# Patient Record
Sex: Male | Born: 1955 | Race: Black or African American | Hispanic: No | Marital: Single | Smoking: Current every day smoker
Health system: Southern US, Community
[De-identification: ages and names within clinical notes are randomized; demographics above are authoritative.]

## PROBLEM LIST (undated history)

## (undated) DIAGNOSIS — E119 Type 2 diabetes mellitus without complications: Secondary | ICD-10-CM

## (undated) DIAGNOSIS — J449 Chronic obstructive pulmonary disease, unspecified: Secondary | ICD-10-CM

## (undated) DIAGNOSIS — J45909 Unspecified asthma, uncomplicated: Secondary | ICD-10-CM

## (undated) DIAGNOSIS — I509 Heart failure, unspecified: Secondary | ICD-10-CM

## (undated) DIAGNOSIS — I2510 Atherosclerotic heart disease of native coronary artery without angina pectoris: Secondary | ICD-10-CM

---

## 2019-07-05 ENCOUNTER — Emergency Department: Payer: Medicare Other

## 2019-07-05 ENCOUNTER — Other Ambulatory Visit: Payer: Self-pay

## 2019-07-05 ENCOUNTER — Emergency Department
Admission: EM | Admit: 2019-07-05 | Discharge: 2019-07-05 | Disposition: A | Discharge status: Home / Self Care | Payer: Medicare Other | Attending: Emergency Medicine | Admitting: Emergency Medicine

## 2019-07-05 ENCOUNTER — Encounter: Payer: Self-pay | Admitting: Emergency Medicine

## 2019-07-05 DIAGNOSIS — Z20822 Contact with and (suspected) exposure to covid-19: Secondary | ICD-10-CM | POA: Diagnosis not present

## 2019-07-05 DIAGNOSIS — R079 Chest pain, unspecified: Secondary | ICD-10-CM | POA: Insufficient documentation

## 2019-07-05 DIAGNOSIS — J45909 Unspecified asthma, uncomplicated: Secondary | ICD-10-CM | POA: Diagnosis not present

## 2019-07-05 DIAGNOSIS — I2510 Atherosclerotic heart disease of native coronary artery without angina pectoris: Secondary | ICD-10-CM | POA: Insufficient documentation

## 2019-07-05 DIAGNOSIS — F17210 Nicotine dependence, cigarettes, uncomplicated: Secondary | ICD-10-CM | POA: Diagnosis not present

## 2019-07-05 DIAGNOSIS — E119 Type 2 diabetes mellitus without complications: Secondary | ICD-10-CM | POA: Insufficient documentation

## 2019-07-05 DIAGNOSIS — R0602 Shortness of breath: Secondary | ICD-10-CM | POA: Diagnosis not present

## 2019-07-05 DIAGNOSIS — J441 Chronic obstructive pulmonary disease with (acute) exacerbation: Secondary | ICD-10-CM | POA: Diagnosis not present

## 2019-07-05 HISTORY — DX: Heart failure, unspecified: I50.9

## 2019-07-05 HISTORY — DX: Type 2 diabetes mellitus without complications: E11.9

## 2019-07-05 HISTORY — DX: Chronic obstructive pulmonary disease, unspecified: J44.9

## 2019-07-05 HISTORY — DX: Atherosclerotic heart disease of native coronary artery without angina pectoris: I25.10

## 2019-07-05 HISTORY — DX: Unspecified asthma, uncomplicated: J45.909

## 2019-07-05 LAB — COMPREHENSIVE METABOLIC PANEL
ALT: 102 U/L — ABNORMAL HIGH (ref 0–44)
AST: 47 U/L — ABNORMAL HIGH (ref 15–41)
Albumin: 4 g/dL (ref 3.5–5.0)
Alkaline Phosphatase: 63 U/L (ref 38–126)
Anion gap: 14 (ref 5–15)
BUN: 35 mg/dL — ABNORMAL HIGH (ref 8–23)
CO2: 27 mmol/L (ref 22–32)
Calcium: 9.1 mg/dL (ref 8.9–10.3)
Chloride: 98 mmol/L (ref 98–111)
Creatinine, Ser: 1.46 mg/dL — ABNORMAL HIGH (ref 0.61–1.24)
GFR calc Af Amer: 59 mL/min — ABNORMAL LOW (ref >60)
GFR calc non Af Amer: 51 mL/min — ABNORMAL LOW (ref >60)
Glucose, Bld: 103 mg/dL — ABNORMAL HIGH (ref 70–99)
Potassium: 3.4 mmol/L — ABNORMAL LOW (ref 3.5–5.1)
Sodium: 139 mmol/L (ref 135–145)
Total Bilirubin: 1.3 mg/dL — ABNORMAL HIGH (ref 0.3–1.2)
Total Protein: 6.7 g/dL (ref 6.5–8.1)

## 2019-07-05 LAB — RESPIRATORY PANEL BY RT PCR (FLU A&B, COVID)
Influenza A by PCR: NEGATIVE
Influenza B by PCR: NEGATIVE
SARS Coronavirus 2 by RT PCR: NEGATIVE

## 2019-07-05 LAB — CBC WITH DIFFERENTIAL/PLATELET
Abs Immature Granulocytes: 0.02 10*3/uL (ref 0.00–0.07)
Basophils Absolute: 0.1 10*3/uL (ref 0.0–0.1)
Basophils Relative: 1 %
Eosinophils Absolute: 0.1 10*3/uL (ref 0.0–0.5)
Eosinophils Relative: 1 %
HCT: 45.4 % (ref 39.0–52.0)
Hemoglobin: 15.4 g/dL (ref 13.0–17.0)
Immature Granulocytes: 0 %
Lymphocytes Relative: 16 %
Lymphs Abs: 0.9 10*3/uL (ref 0.7–4.0)
MCH: 32.1 pg (ref 26.0–34.0)
MCHC: 33.9 g/dL (ref 30.0–36.0)
MCV: 94.6 fL (ref 80.0–100.0)
Monocytes Absolute: 0.4 10*3/uL (ref 0.1–1.0)
Monocytes Relative: 6 %
Neutro Abs: 4.2 10*3/uL (ref 1.7–7.7)
Neutrophils Relative %: 76 %
Platelets: 209 10*3/uL (ref 150–400)
RBC: 4.8 MIL/uL (ref 4.22–5.81)
RDW: 15.5 % (ref 11.5–15.5)
WBC: 5.5 10*3/uL (ref 4.0–10.5)
nRBC: 0 % (ref 0.0–0.2)

## 2019-07-05 LAB — TROPONIN I (HIGH SENSITIVITY)
Troponin I (High Sensitivity): 45 ng/L — ABNORMAL HIGH (ref <18)
Troponin I (High Sensitivity): 39 ng/L — ABNORMAL HIGH (ref <18)

## 2019-07-05 LAB — BRAIN NATRIURETIC PEPTIDE: B Natriuretic Peptide: 2514 pg/mL — ABNORMAL HIGH (ref 0.0–100.0)

## 2019-07-05 MED ORDER — IPRATROPIUM-ALBUTEROL 0.5-2.5 (3) MG/3ML IN SOLN
3.0000 mL | Freq: Once | RESPIRATORY_TRACT | Status: AC
  Administered 2019-07-05: 3 mL via RESPIRATORY_TRACT
  Filled 2019-07-05: qty 3

## 2019-07-05 MED ORDER — ALBUTEROL SULFATE HFA 108 (90 BASE) MCG/ACT IN AERS: 2.0000 | INHALATION_SPRAY | Freq: Four times a day (QID) | RESPIRATORY_TRACT | 1 refills | Status: DC | PRN

## 2019-07-05 MED ORDER — PREDNISONE 20 MG PO TABS
60.0000 mg | ORAL_TABLET | Freq: Once | ORAL | Status: AC
  Administered 2019-07-05: 60 mg via ORAL
  Filled 2019-07-05: qty 3

## 2019-07-05 MED ORDER — PREDNISONE 20 MG PO TABS: 20.0000 mg | ORAL_TABLET | Freq: Every day | ORAL | 0 refills | Status: AC

## 2019-07-05 MED ORDER — IOHEXOL 300 MG/ML  SOLN
75.0000 mL | Freq: Once | INTRAMUSCULAR | Status: AC | PRN
  Administered 2019-07-05: 14:00:00 75 mL via INTRAVENOUS

## 2019-07-05 NOTE — Discharge Instructions (Addendum)
Your Covid test today is negative.  You should follow-up with a cardiologist.  We have given you a name for a doctor at each of our 2 cardiology practices.  You can choose whichever one works best for you.  Take the prednisone 60 mg again tomorrow, then 40 mg for 2 days, and then go back to your normal daily dose of 20 mg.  Use the albuterol inhaler every 4-6 hours for the next several days as needed.  Continue taking your aspirin, Lasix, and other medications as prescribed.  Return to the ER for new, worsening, or persistent severe chest pain, difficulty breathing, weakness or lightheadedness, or any other new or worsening symptoms that concern you.

## 2019-07-05 NOTE — ED Triage Notes (Signed)
Patient presents to the ED with chest pain since last night.  Patient states the pain is constant and feels like someone is sitting on his chest.  Patient also reports shortness of breath for several days.  Patient states he normally takes prednisone but has been out for approx. 1 month.  Patient recently moved from [place].  Patient has history of blood clots, swelling in his legs and diabetes.  Patient states he takes lasix and blood thinners.

## 2019-07-05 NOTE — ED Notes (Signed)
Pt trx to CT.  

## 2019-07-05 NOTE — ED Provider Notes (Signed)
[facility] Emergency Department Provider Note ____________________________________________   First MD Initiated Contact with Patient 07/05/19 [phone]     (approximate)  I have reviewed the triage vital signs and the nursing notes.   HISTORY  Chief Complaint Chest Pain    HPI Scott Mcknight is a 64 y.o. male with PMH as noted below who presents with chest pain which has been constant over the last several weeks but worse since last night, described as a pressure-like sensation or something sitting on his chest.  He reports exertional shortness of breath for several weeks as well.  The patient states that he recently came here from [place].  He states that he has been off of his daily prednisone for about 1 month and states he has been feeling worse since then.  He reports that he has a history of CAD and CHF and was told that he should get a stent, but does not have any stents.  He denies any known COVID-19 exposures.  Past Medical History:  Diagnosis Date  . Asthma   . CHF (congestive heart failure) (HCC)   . COPD (chronic obstructive pulmonary disease) (HCC)   . Coronary artery disease   . Diabetes mellitus without complication (HCC)     There are no problems to display for this patient.    Prior to Admission medications   Medication Sig Start Date End Date Taking? Authorizing Provider  albuterol (VENTOLIN HFA) 108 (90 Base) MCG/ACT inhaler Inhale 2 puffs into the lungs every 6 (six) hours as needed for wheezing or shortness of breath. 07/05/19   Dionne Bucy, MD  predniSONE (DELTASONE) 20 MG tablet Take 1 tablet (20 mg total) by mouth daily. 07/05/19 08/04/19  Dionne Bucy, MD    Allergies Patient has no known allergies.  No family history on file.  Social History Social History   Tobacco Use  . Smoking status: Current Every Day Smoker    Packs/day: 0.50    Types: Cigarettes  Substance Use Topics  . Alcohol use: Yes   Comment: occasionally  . Drug use: Not on file    Review of Systems  Constitutional: No fever. Eyes: No redness. ENT: No sore throat. Cardiovascular: Positive for chest pain. Respiratory: Positive for shortness of breath. Gastrointestinal: No vomiting or diarrhea.  Genitourinary: Negative for dysuria.  Musculoskeletal: Negative for back pain. Skin: Negative for rash. Neurological: Negative for headache.   ____________________________________________   PHYSICAL EXAM:  VITAL SIGNS: ED Triage Vitals  Enc Vitals Group     BP 07/05/19 0921 (!) 121/96     Pulse Rate 07/05/19 0921 (!) 49     Resp 07/05/19 0921 16     Temp 07/05/19 0921 98 F (36.7 C)     Temp Source 07/05/19 0921 Oral     SpO2 07/05/19 0921 91 %     Weight 07/05/19 0923 190 lb (86.2 kg)     Height 07/05/19 0923 5\' 11"  (1.803 m)     Head Circumference --      Peak Flow --      Pain Score 07/05/19 0923 6     Pain Loc --      Pain Edu? --      Excl. in GC? --     Constitutional: Alert and oriented.  Relatively well appearing and in no acute distress. Eyes: Conjunctivae are normal.  EOMI. Head: Atraumatic. Nose: No congestion/rhinnorhea. Mouth/Throat: Mucous membranes are moist.   Neck: Normal range of motion.  Cardiovascular:  Normal rate, regular rhythm. Grossly normal heart sounds.  Good peripheral circulation. Respiratory: Slightly increased respiratory effort.  No retractions.  Diffuse wheezing bilaterally. Gastrointestinal: Soft and nontender. No distention.  Genitourinary: No flank tenderness. Musculoskeletal: 1+ bilateral lower extremity edema.  Extremities warm and well perfused.  Neurologic:  Normal speech and language. No gross focal neurologic deficits are appreciated.  Skin:  Skin is warm and dry. No rash noted. Psychiatric: Mood and affect are normal. Speech and behavior are normal.  ____________________________________________   LABS (all labs ordered are listed, but only abnormal  results are displayed)  Labs Reviewed  COMPREHENSIVE METABOLIC PANEL - Abnormal; Notable for the following components:      Result Value   Potassium 3.4 (*)    Glucose, Bld 103 (*)    BUN 35 (*)    Creatinine, Ser 1.46 (*)    AST 47 (*)    ALT 102 (*)    Total Bilirubin 1.3 (*)    GFR calc non Af Amer 51 (*)    GFR calc Af Amer 59 (*)    All other components within normal limits  BRAIN NATRIURETIC PEPTIDE - Abnormal; Notable for the following components:   B Natriuretic Peptide 2,514.0 (*)    All other components within normal limits  TROPONIN I (HIGH SENSITIVITY) - Abnormal; Notable for the following components:   Troponin I (High Sensitivity) 45 (*)    All other components within normal limits  TROPONIN I (HIGH SENSITIVITY) - Abnormal; Notable for the following components:   Troponin I (High Sensitivity) 39 (*)    All other components within normal limits  RESPIRATORY PANEL BY RT PCR (FLU A&B, COVID)  CBC WITH DIFFERENTIAL/PLATELET   ____________________________________________  EKG  ED ECG REPORT I, Arta Silence, the attending physician, personally viewed and interpreted this ECG.  Date: 07/05/2019 EKG Time: 09 18 Rate: 100 Rhythm: normal sinus rhythm QRS Axis: Right axis Intervals: Nonspecific IVCD ST/T Wave abnormalities: Nonspecific ST abnormalities inferior and lateral Narrative Interpretation: Nonspecific ST abnormalities inferior lateral with no prior EKG available for comparison   ED ECG REPORT I, Arta Silence, the attending physician, personally viewed and interpreted this ECG.  Date: 07/05/2019 EKG Time: 1343 Rate: 85 Rhythm: normal sinus rhythm QRS Axis: normal Intervals: LBBB ST/T Wave abnormalities: Nonspecific ST abnormalities unchanged when compared to EKG of 918 today Narrative Interpretation: Nonspecific ST abnormalities with no evidence of acute ischemia   ____________________________________________  RADIOLOGY  CXR:    ____________________________________________   PROCEDURES  Procedure(s) performed: No  Procedures  Critical Care performed: No ____________________________________________   INITIAL IMPRESSION / ASSESSMENT AND PLAN / ED COURSE  Pertinent labs & imaging results that were available during my care of the patient were reviewed by me and considered in my medical decision making (see chart for details).  64 year old male with PMH as noted above presents with gradual onset of worsening shortness of breath and chest pain over the last several weeks.  The patient states that he has been off of his daily prednisone 20 mg for about 1 month.  He reports a history of CAD, but does not have any stents.  He states he also was told he has CHF and is on a diuretic.  He takes daily aspirin and confirms that he took it today.  On exam, the patient is overall relatively well-appearing.  His vital signs are normal except for borderline low O2 saturation.  He has slightly increased work of breathing but no acute respiratory distress and  is speaking in full sentences.  Lung exam reveals diffuse wheezing bilaterally.  He has mild peripheral edema.  Differential includes most likely acute on chronic COPD given the predominance of wheezing, versus possible acute on chronic CHF or possible ACS given his high risk cardiac history.  Although the patient apparently has a history of DVT, I have a low suspicion for PE given the gradual nature of the symptoms, his reassuring vital signs, and the presence of wheezing.  We will give duo nebs and steroid, obtain a chest x-ray, lab work-up, and reassess.  ----------------------------------------- 2:42 PM on 07/05/2019 -----------------------------------------  The patient stated he felt much better after the nebs and steroid.  He states his symptoms are resolved.  On reassessment he appears comfortable.  The lab work-up is notable for a slightly elevated troponin, as  well as a significantly elevated BNP.  Since the patient has no prior records here, I do not have old values available for comparison.  I initially discussed with the patient the possibility of admission for additional cardiac work-up and ACS rule out given his cardiac history and the elevated troponin.  However, the patient states that since he feels well, he would strongly prefer to go home if at all possible.  We discussed the possibility of a repeat troponin and possible admission if there was a significant rise signifying an acute cardiac process.  Repeat troponin is  unchanged.  I had extensive discussion with the patient about his work-up and the risks and benefits of further observation versus discharge.  He demonstrates appropriate understanding of these risks, and strongly prefers to be discharged home.  At this time, there is no evidence of an acute MI or other acute cardiac event.  There is no evidence of PE.  The BNP is likely baseline elevated.  The patient is stable for discharge.  I have given the patient a cardiology referral and I have prescribed albuterol and prednisone.  Patient states that he has all of his other medications.  I gave him thorough return precautions and he expressed understanding. _____________________________  Tommy Rainwater was evaluated in Emergency Department on 07/05/2019 for the symptoms described in the history of present illness. He was evaluated in the context of the global COVID-19 pandemic, which necessitated consideration that the patient might be at risk for infection with the SARS-CoV-2 virus that causes COVID-19. Institutional protocols and algorithms that pertain to the evaluation of patients at risk for COVID-19 are in a state of rapid change based on information released by regulatory bodies including the CDC and federal and state organizations. These policies and algorithms were followed during the patient's care in the  ED.  ____________________________________________   FINAL CLINICAL IMPRESSION(S) / ED DIAGNOSES  Final diagnoses:  Chest pain, unspecified type  COPD exacerbation (HCC)      NEW MEDICATIONS STARTED DURING THIS VISIT:  New Prescriptions   ALBUTEROL (VENTOLIN HFA) 108 (90 BASE) MCG/ACT INHALER    Inhale 2 puffs into the lungs every 6 (six) hours as needed for wheezing or shortness of breath.   PREDNISONE (DELTASONE) 20 MG TABLET    Take 1 tablet (20 mg total) by mouth daily.     Note:  This document was prepared using Dragon voice recognition software and may include unintentional dictation errors.    Dionne Bucy, MD 07/05/19 1446

## 2019-07-05 NOTE — ED Notes (Signed)
Pt unhooked himself from monitor to use restroom and I got him put back on the monitor.

## 2019-07-05 NOTE — ED Notes (Signed)
Provided pt w/ graham crackers and soda on request w/ permission from EDP.

## 2019-07-12 ENCOUNTER — Ambulatory Visit: Payer: Medicaid Other | Admitting: Cardiology

## 2019-07-13 ENCOUNTER — Ambulatory Visit: Payer: Medicaid Other | Admitting: Cardiology

## 2019-07-16 ENCOUNTER — Ambulatory Visit: Payer: Medicaid Other | Admitting: Cardiology

## 2019-07-18 ENCOUNTER — Encounter: Payer: Self-pay | Admitting: Cardiology

## 2019-08-19 ENCOUNTER — Emergency Department: Payer: Medicare Other

## 2019-08-19 ENCOUNTER — Emergency Department
Admission: EM | Admit: 2019-08-19 | Discharge: 2019-08-19 | Disposition: A | Discharge status: Home / Self Care | Payer: Medicare Other | Attending: Student | Admitting: Student

## 2019-08-19 ENCOUNTER — Other Ambulatory Visit: Payer: Self-pay

## 2019-08-19 DIAGNOSIS — R0789 Other chest pain: Secondary | ICD-10-CM | POA: Insufficient documentation

## 2019-08-19 DIAGNOSIS — J449 Chronic obstructive pulmonary disease, unspecified: Secondary | ICD-10-CM | POA: Insufficient documentation

## 2019-08-19 DIAGNOSIS — I2510 Atherosclerotic heart disease of native coronary artery without angina pectoris: Secondary | ICD-10-CM | POA: Diagnosis not present

## 2019-08-19 DIAGNOSIS — I509 Heart failure, unspecified: Secondary | ICD-10-CM | POA: Diagnosis not present

## 2019-08-19 DIAGNOSIS — E119 Type 2 diabetes mellitus without complications: Secondary | ICD-10-CM | POA: Insufficient documentation

## 2019-08-19 DIAGNOSIS — F17210 Nicotine dependence, cigarettes, uncomplicated: Secondary | ICD-10-CM | POA: Diagnosis not present

## 2019-08-19 DIAGNOSIS — J441 Chronic obstructive pulmonary disease with (acute) exacerbation: Secondary | ICD-10-CM

## 2019-08-19 DIAGNOSIS — R1033 Periumbilical pain: Secondary | ICD-10-CM | POA: Diagnosis present

## 2019-08-19 DIAGNOSIS — J45909 Unspecified asthma, uncomplicated: Secondary | ICD-10-CM | POA: Diagnosis not present

## 2019-08-19 DIAGNOSIS — R0602 Shortness of breath: Secondary | ICD-10-CM | POA: Diagnosis not present

## 2019-08-19 LAB — CBC
HCT: 44.9 % (ref 39.0–52.0)
Hemoglobin: 14.8 g/dL (ref 13.0–17.0)
MCH: 31.3 pg (ref 26.0–34.0)
MCHC: 33 g/dL (ref 30.0–36.0)
MCV: 94.9 fL (ref 80.0–100.0)
Platelets: 256 10*3/uL (ref 150–400)
RBC: 4.73 MIL/uL (ref 4.22–5.81)
RDW: 14.1 % (ref 11.5–15.5)
WBC: 5.5 10*3/uL (ref 4.0–10.5)
nRBC: 0 % (ref 0.0–0.2)

## 2019-08-19 LAB — COMPREHENSIVE METABOLIC PANEL
ALT: 17 U/L (ref 0–44)
AST: 26 U/L (ref 15–41)
Albumin: 4.3 g/dL (ref 3.5–5.0)
Alkaline Phosphatase: 66 U/L (ref 38–126)
Anion gap: 11 (ref 5–15)
BUN: 11 mg/dL (ref 8–23)
CO2: 27 mmol/L (ref 22–32)
Calcium: 9.8 mg/dL (ref 8.9–10.3)
Chloride: 100 mmol/L (ref 98–111)
Creatinine, Ser: 1.26 mg/dL — ABNORMAL HIGH (ref 0.61–1.24)
GFR calc Af Amer: >60 mL/min (ref >60)
GFR calc non Af Amer: >60 mL/min (ref >60)
Glucose, Bld: 113 mg/dL — ABNORMAL HIGH (ref 70–99)
Potassium: 4.2 mmol/L (ref 3.5–5.1)
Sodium: 138 mmol/L (ref 135–145)
Total Bilirubin: 0.6 mg/dL (ref 0.3–1.2)
Total Protein: 7.7 g/dL (ref 6.5–8.1)

## 2019-08-19 LAB — TROPONIN I (HIGH SENSITIVITY)
Troponin I (High Sensitivity): 25 ng/L — ABNORMAL HIGH (ref <18)
Troponin I (High Sensitivity): 24 ng/L — ABNORMAL HIGH (ref <18)

## 2019-08-19 MED ORDER — AZITHROMYCIN 250 MG PO TABS: 250.0000 mg | ORAL_TABLET | Freq: Every day | ORAL | 0 refills | Status: AC

## 2019-08-19 MED ORDER — PREDNISONE 20 MG PO TABS: 40.0000 mg | ORAL_TABLET | Freq: Once | ORAL | Status: DC

## 2019-08-19 MED ORDER — IOHEXOL 350 MG/ML SOLN
100.0000 mL | Freq: Once | INTRAVENOUS | Status: AC | PRN
  Administered 2019-08-19: 100 mL via INTRAVENOUS

## 2019-08-19 MED ORDER — MORPHINE SULFATE (PF) 4 MG/ML IV SOLN
4.0000 mg | Freq: Once | INTRAVENOUS | Status: AC
  Administered 2019-08-19: 4 mg via INTRAVENOUS
  Filled 2019-08-19: qty 1

## 2019-08-19 MED ORDER — AZITHROMYCIN 500 MG PO TABS
500.0000 mg | ORAL_TABLET | Freq: Once | ORAL | Status: AC
  Administered 2019-08-19: 500 mg via ORAL
  Filled 2019-08-19: qty 1

## 2019-08-19 MED ORDER — PREDNISONE 20 MG PO TABS: 40.0000 mg | ORAL_TABLET | Freq: Every day | ORAL | 0 refills | Status: AC

## 2019-08-19 MED ORDER — SODIUM CHLORIDE 0.9 % IV BOLUS
1000.0000 mL | Freq: Once | INTRAVENOUS | Status: AC
  Administered 2019-08-19: 1000 mL via INTRAVENOUS

## 2019-08-19 MED ORDER — METHYLPREDNISOLONE SODIUM SUCC 125 MG IJ SOLR
125.0000 mg | Freq: Once | INTRAMUSCULAR | Status: AC
  Administered 2019-08-19: 125 mg via INTRAVENOUS
  Filled 2019-08-19: qty 2

## 2019-08-19 MED ORDER — IPRATROPIUM-ALBUTEROL 0.5-2.5 (3) MG/3ML IN SOLN
3.0000 mL | Freq: Once | RESPIRATORY_TRACT | Status: AC
  Administered 2019-08-19: 3 mL via RESPIRATORY_TRACT
  Filled 2019-08-19: qty 3

## 2019-08-19 MED ORDER — ONDANSETRON HCL 4 MG/2ML IJ SOLN
4.0000 mg | Freq: Once | INTRAMUSCULAR | Status: AC
  Administered 2019-08-19: 4 mg via INTRAVENOUS
  Filled 2019-08-19: qty 2

## 2019-08-19 NOTE — ED Provider Notes (Signed)
[facility] Emergency Department Provider Note  ____________________________________________   First MD Initiated Contact with Patient 08/19/19 1728     (approximate)  I have reviewed the triage vital signs and the nursing notes.   HISTORY  Chief Complaint Abdominal Pain and Shortness of Breath   HPI Scott Mcknight is a 64 y.o. male with a history of CAD, COPD, CHF, and diabetes who presents to the emergency department for treatment and evaluation of periumbilical pain and chest pain.   Pain initially was in the abdomen and has now progressed up into his chest.  Symptoms started 2 days ago.   Past Medical History:  Diagnosis Date  . Asthma   . CHF (congestive heart failure) (HCC)   . COPD (chronic obstructive pulmonary disease) (HCC)   . Coronary artery disease   . Diabetes mellitus without complication (HCC)     There are no problems to display for this patient.   History reviewed. No pertinent surgical history.  Prior to Admission medications   Medication Sig Start Date End Date Taking? Authorizing Provider  albuterol (VENTOLIN HFA) 108 (90 Base) MCG/ACT inhaler Inhale 2 puffs into the lungs every 6 (six) hours as needed for wheezing or shortness of breath. 07/05/19   Dionne Bucy, MD    Allergies Patient has no known allergies.  No family history on file.  Social History Social History   Tobacco Use  . Smoking status: Current Every Day Smoker    Packs/day: 0.50    Types: Cigarettes  . Smokeless tobacco: Never Used  Substance Use Topics  . Alcohol use: Yes    Comment: occasionally  . Drug use: Never    Review of Systems  Constitutional: No fever/chills. Eyes: No visual changes. ENT: No sore throat. Cardiovascular: Positive for chest pain.  Negative for pleuritic pain.  Negative for palpitations.  Negative for leg pain. Respiratory: Positive for shortness of breath. Gastrointestinal: Positive for abdominal pain.  Negative  for nausea, no vomiting.  No diarrhea.  No constipation. Genitourinary: Negative for dysuria. Musculoskeletal: Negative for back pain.  Skin: Negative for rash, lesion, wound. Neurological: Negative for headaches, focal weakness or numbness. ___________________________________________   PHYSICAL EXAM:  VITAL SIGNS: ED Triage Vitals  Enc Vitals Group     BP 08/19/19 1717 101/83     Pulse Rate 08/19/19 1717 (!) 45     Resp 08/19/19 1717 (!) 24     Temp 08/19/19 1717 98 F (36.7 C)     Temp Source 08/19/19 1717 Oral     SpO2 08/19/19 1717 95 %     Weight 08/19/19 1719 180 lb (81.6 kg)     Height 08/19/19 1719 5\' 11"  (1.803 m)     Head Circumference --      Peak Flow --      Pain Score 08/19/19 1718 8     Pain Loc --      Pain Edu? --      Excl. in GC? --     Constitutional: Alert and oriented.  Uncomfortable appearing and in no acute distress.  Normal mental status. Eyes: Conjunctivae are normal. PERRL. Head: Atraumatic. Nose: No congestion/rhinnorhea. Mouth/Throat: Mucous membranes are moist.  Oropharynx non-erythematous. Tongue normal in size and color. Neck: No stridor.  No carotid bruit appreciated on exam. Hematological/Lymphatic/Immunilogical: No cervical lymphadenopathy. Cardiovascular: Normal rate, regular rhythm. Grossly normal heart sounds.  Good peripheral circulation. Respiratory: Normal respiratory effort.  No retractions. Lungs with diffuse expiratory wheeze. Gastrointestinal: Soft. No distention.  No abdominal bruits. Genitourinary: Exam deferred. Musculoskeletal: No lower extremity tenderness.  No edema of extremities. Neurologic:  Normal speech and language. No gross focal neurologic deficits are appreciated. Skin:  Skin is warm, dry and intact. No rash noted. Psychiatric: Mood and affect are normal. Speech and behavior are normal.  ____________________________________________   LABS (all labs ordered are listed, but only abnormal results are  displayed)  Labs Reviewed  COMPREHENSIVE METABOLIC PANEL - Abnormal; Notable for the following components:      Result Value   Glucose, Bld 113 (*)    Creatinine, Ser 1.26 (*)    All other components within normal limits  TROPONIN I (HIGH SENSITIVITY) - Abnormal; Notable for the following components:   Troponin I (High Sensitivity) 25 (*)    All other components within normal limits  CBC  TROPONIN I (HIGH SENSITIVITY)   ____________________________________________  EKG  ED ECG REPORT I, Julieana Eshleman, FNP-BC personally viewed and interpreted this ECG.   Date: 08/19/2019  EKG Time: 1723  Rate: 99  Rhythm: unchanged from previous tracings, frequent PVC's noted, prolonged QT interval  Axis: Right  Intervals:none  ST&T Change: no ST elevation  ____________________________________________  RADIOLOGY  ED MD interpretation:    Chest x-ray negative for acute cardiopulmonary abnormality.  I, Kem Boroughs, personally viewed and evaluated these images (plain radiographs) as part of my medical decision making, as well as reviewing the written report by the radiologist.  Official radiology report(s): DG Chest Port 1 View  Result Date: 08/19/2019 CLINICAL DATA:  Chest pain EXAM: PORTABLE CHEST 1 VIEW COMPARISON:  Chest radiograph dated 07/05/2019 FINDINGS: Heart is mildly enlarged. Both lungs are clear. The visualized skeletal structures are unremarkable. IMPRESSION: No active disease. Electronically Signed   By: Romona Curls M.D.   On: 08/19/2019 18:23   CT Angio Chest/Abd/Pel for Dissection W and/or Wo Contrast  Result Date: 08/19/2019 CLINICAL DATA:  Short of breath, abdominal pain EXAM: CT ANGIOGRAPHY CHEST, ABDOMEN AND PELVIS TECHNIQUE: Multidetector CT imaging through the chest, abdomen and pelvis was performed using the standard protocol during bolus administration of intravenous contrast. Multiplanar reconstructed images and MIPs were obtained and reviewed to evaluate the  vascular anatomy. CONTRAST:  OMNIPAQUE IOHEXOL 350 MG/ML SOLN COMPARISON:  07/05/2019 FINDINGS: CTA CHEST FINDINGS Cardiovascular: This is a technically adequate evaluation of the thoracic aorta. No aneurysm or dissection. The heart is mildly enlarged but stable. Continued enlargement of the pulmonary vasculature consistent with pulmonary arterial hypertension. While not optimized for opacification of the pulmonary vasculature, there are no filling defects or pulmonary emboli identified. Mediastinum/Nodes: No enlarged mediastinal, hilar, or axillary lymph nodes. Thyroid gland, trachea, and esophagus demonstrate no significant findings. Lungs/Pleura: Stable background emphysema, upper lobe predominant. No airspace disease, effusion, or pneumothorax. Central airways are patent. Musculoskeletal: No acute or destructive bony lesions. Reconstructed images demonstrate no additional findings. Review of the MIP images confirms the above findings. CTA ABDOMEN AND PELVIS FINDINGS VASCULAR Aorta: Normal caliber aorta without aneurysm, dissection, vasculitis or significant stenosis. Celiac: Patent without evidence of aneurysm, dissection, vasculitis or significant stenosis. SMA: Patent without evidence of aneurysm, dissection, vasculitis or significant stenosis. Renals: Both renal arteries are patent without evidence of aneurysm, dissection, vasculitis, fibromuscular dysplasia or significant stenosis. IMA: Patent without evidence of aneurysm, dissection, vasculitis or significant stenosis. Inflow: Patent without evidence of aneurysm, dissection, vasculitis or significant stenosis. Veins: No obvious venous abnormality within the limitations of this arterial phase study. Review of the MIP images confirms the above findings. NON-VASCULAR  Hepatobiliary: No focal liver abnormality is seen. No gallstones, gallbladder wall thickening, or biliary dilatation. Pancreas: Unremarkable. No pancreatic ductal dilatation or surrounding  inflammatory changes. Spleen: Normal in size without focal abnormality. Adrenals/Urinary Tract: Adrenal glands are unremarkable. Kidneys are normal, without renal calculi, focal lesion, or hydronephrosis. Bladder is unremarkable. Stomach/Bowel: No bowel obstruction or ileus. Normal appendix right lower quadrant. Scattered diverticulosis of the descending and sigmoid colon without diverticulitis. No bowel wall thickening or inflammatory change. Lymphatic: No pathologic adenopathy within the abdomen or pelvis. Reproductive: Prostate is unremarkable. Other: No abdominal wall hernia or abnormality. No abdominopelvic ascites. Musculoskeletal: There is severe osteoarthritis of the right hip, with complete loss of joint space, remodeling of the femoral head and acetabular roof, subchondral cyst formation, eburnation, and marked osteophyte formation. There are no acute displaced fractures. Reconstructed images demonstrate no additional findings. Review of the MIP images confirms the above findings. IMPRESSION: 1. No evidence of aortic aneurysm or dissection. 2. No evidence of pulmonary embolus. Stable pulmonary arterial enlargement consistent with pulmonary arterial hypertension. 3.  Emphysema (ICD10-J43.9). 4. Diverticulosis without diverticulitis. 5. No acute intrathoracic, intra-abdominal, or intrapelvic process. Electronically Signed   By: Randa Ngo M.D.   On: 08/19/2019 18:02    ____________________________________________   PROCEDURES  Procedure(s) performed: None  Procedures  Critical Care performed: No  ____________________________________________   INITIAL IMPRESSION / ASSESSMENT AND PLAN / ED COURSE  As part of my medical decision making, I reviewed the following data within the [facility] EKG reviewed   64 year old male presenting to the emergency department for treatment and evaluation of abdominal and chest pain.  Symptoms started 2 days ago and have progressively  worsened.  See HPI for further details.  Plan will be to get labs and CTA of the chest, abdomen, and pelvis. ____________________________________________  Differential diagnosis includes, but not limited to:  Dissection, non-STEMI, abdominal pain, chest pain   FINAL CLINICAL IMPRESSION(S) / ED DIAGNOSES ----------------------------------------- 6:14 PM on 08/19/2019 -----------------------------------------  CTA is reassuring. No evidence of dissection or other acute findings. Troponin is still pending. CBC is normal. Pain "easing down" and is now 6/10. Vital signs are stable. IV fluids infusing. Will monitor.   ----------------------------------------- 7:08 PM on 08/19/2019 -----------------------------------------  Initial troponin is 25. One month ago it was 68 and 61. Patient states pain is "nearly gone." Breathing treatment and solu-medrol ordered.   ----------------------------------------- 8:23 PM on 08/19/2019 -----------------------------------------  Care will be transitioned to Dr. Joan Mayans who will follow up on the second troponin and determine disposition.  Final diagnoses:  Periumbilical abdominal pain  Atypical chest pain     ED Discharge Orders    None       Granvel Djordjevic was evaluated in Emergency Department on 08/19/2019 for the symptoms described in the history of present illness. He was evaluated in the context of the global COVID-19 pandemic, which necessitated consideration that the patient might be at risk for infection with the SARS-CoV-2 virus that causes COVID-19. Institutional protocols and algorithms that pertain to the evaluation of patients at risk for COVID-19 are in a state of rapid change based on information released by regulatory bodies including the CDC and federal and state organizations. These policies and algorithms were followed during the patient's care in the ED.   Note:  This document was prepared using Dragon voice recognition software  and may include unintentional dictation errors.   Victorino Dike, FNP 08/19/19 2023    Lilia Pro., MD 08/20/19 [id number]

## 2019-08-19 NOTE — ED Provider Notes (Signed)
64 year old male, history of CAD, COPD, heart failure who presents to the emergency department for shortness of breath, chest pain, abdominal pain.  Patient states symptoms started 2 days ago.  Pain initially located in his abdomen, generalized, felt like gas pains.  Pain seemed to radiate up somewhat into his epigastrium and lower chest area.  Took some over-the-counter gas medication without significant relief.  Was able to have a large bowel movement with significant relief of symptoms and thought that symptoms had resolved until today when again he developed some pains, prompting him to seek care. Also complains of increased SOB above his baseline COPD and more productive cough. Does not wear supplemental oxygen.   On my evaluation, after nebulizer treatment he states his breathing is much improved. Has rare scattered wheezing. Abdomen is soft and completely NT throughout after pain medications. He feels well. No oxygen requirement.  CT dissection negative for dissection or PE, emphysema noted. EKG with BBB seen prior. HS troponin 25 then 24, downtrending and actually lower than priors.   He feels much improved after nebulizer and medications and desires discharge. Given negative work up, feel this is reasonable. Will treat for COPD exacerbation and advised PCP follow up.    Scott Mcknight., MD 08/20/19 [id number]

## 2019-08-19 NOTE — ED Triage Notes (Signed)
Pt c/o abd pain and SHOB - he reports that the pain only comes when he takes his medication - Pt is on eliquis for recent blood clots in legs and neck

## 2019-08-19 NOTE — ED Triage Notes (Signed)
FIRST NURSE NOTE:  Pt c/o SOB, abdominal pain, states he was seen somewhere recently and given some pills but states they aren't helping any more.    Pt is short of breath, but refusing to sit in wheelchair. Pt arrived with bag of medications.

## 2019-08-19 NOTE — Discharge Instructions (Addendum)
Thank you for letting us take care of you in the emergency department today.   Please continue to take any regular, prescribed medications.   New medications we have prescribed:  Prednisone steroids Azithromycin antibiotic  Please follow up with: Your primary care doctor to review your ER visit and follow up on your symptoms.    Please return to the ER for any new or worsening symptoms.

## 2019-08-30 ENCOUNTER — Emergency Department: Payer: Medicare Other

## 2019-08-30 ENCOUNTER — Inpatient Hospital Stay
Admission: EM | Admit: 2019-08-30 | Discharge: 2019-08-30 | DRG: 291 | Discharge status: Against Medical Advice | Payer: Medicare Other | Attending: Internal Medicine | Admitting: Internal Medicine

## 2019-08-30 ENCOUNTER — Other Ambulatory Visit: Payer: Self-pay

## 2019-08-30 ENCOUNTER — Inpatient Hospital Stay: Payer: Medicare Other

## 2019-08-30 DIAGNOSIS — E1129 Type 2 diabetes mellitus with other diabetic kidney complication: Secondary | ICD-10-CM | POA: Diagnosis present

## 2019-08-30 DIAGNOSIS — E785 Hyperlipidemia, unspecified: Secondary | ICD-10-CM | POA: Diagnosis present

## 2019-08-30 DIAGNOSIS — R079 Chest pain, unspecified: Secondary | ICD-10-CM | POA: Diagnosis present

## 2019-08-30 DIAGNOSIS — J441 Chronic obstructive pulmonary disease with (acute) exacerbation: Secondary | ICD-10-CM | POA: Diagnosis present

## 2019-08-30 DIAGNOSIS — R778 Other specified abnormalities of plasma proteins: Secondary | ICD-10-CM | POA: Diagnosis not present

## 2019-08-30 DIAGNOSIS — Z86718 Personal history of other venous thrombosis and embolism: Secondary | ICD-10-CM

## 2019-08-30 DIAGNOSIS — I82409 Acute embolism and thrombosis of unspecified deep veins of unspecified lower extremity: Secondary | ICD-10-CM | POA: Diagnosis present

## 2019-08-30 DIAGNOSIS — I5023 Acute on chronic systolic (congestive) heart failure: Secondary | ICD-10-CM | POA: Diagnosis present

## 2019-08-30 DIAGNOSIS — I5033 Acute on chronic diastolic (congestive) heart failure: Secondary | ICD-10-CM

## 2019-08-30 DIAGNOSIS — J9601 Acute respiratory failure with hypoxia: Secondary | ICD-10-CM

## 2019-08-30 DIAGNOSIS — Z9114 Patient's other noncompliance with medication regimen: Secondary | ICD-10-CM | POA: Diagnosis not present

## 2019-08-30 DIAGNOSIS — Z5329 Procedure and treatment not carried out because of patient's decision for other reasons: Secondary | ICD-10-CM | POA: Diagnosis present

## 2019-08-30 DIAGNOSIS — Z79899 Other long term (current) drug therapy: Secondary | ICD-10-CM | POA: Diagnosis not present

## 2019-08-30 DIAGNOSIS — Z7984 Long term (current) use of oral hypoglycemic drugs: Secondary | ICD-10-CM

## 2019-08-30 DIAGNOSIS — Z20822 Contact with and (suspected) exposure to covid-19: Secondary | ICD-10-CM | POA: Diagnosis present

## 2019-08-30 DIAGNOSIS — F17210 Nicotine dependence, cigarettes, uncomplicated: Secondary | ICD-10-CM | POA: Diagnosis present

## 2019-08-30 DIAGNOSIS — I248 Other forms of acute ischemic heart disease: Secondary | ICD-10-CM | POA: Diagnosis present

## 2019-08-30 DIAGNOSIS — Z720 Tobacco use: Secondary | ICD-10-CM | POA: Diagnosis present

## 2019-08-30 DIAGNOSIS — I2510 Atherosclerotic heart disease of native coronary artery without angina pectoris: Secondary | ICD-10-CM | POA: Diagnosis present

## 2019-08-30 DIAGNOSIS — T68XXXA Hypothermia, initial encounter: Secondary | ICD-10-CM | POA: Diagnosis not present

## 2019-08-30 DIAGNOSIS — R680 Hypothermia, not associated with low environmental temperature: Secondary | ICD-10-CM | POA: Diagnosis present

## 2019-08-30 DIAGNOSIS — J9621 Acute and chronic respiratory failure with hypoxia: Secondary | ICD-10-CM | POA: Diagnosis present

## 2019-08-30 LAB — CBC
HCT: 45.2 % (ref 39.0–52.0)
Hemoglobin: 15.1 g/dL (ref 13.0–17.0)
MCH: 31.8 pg (ref 26.0–34.0)
MCHC: 33.4 g/dL (ref 30.0–36.0)
MCV: 95.2 fL (ref 80.0–100.0)
Platelets: 309 10*3/uL (ref 150–400)
RBC: 4.75 MIL/uL (ref 4.22–5.81)
RDW: 14.1 % (ref 11.5–15.5)
WBC: 5.4 10*3/uL (ref 4.0–10.5)
nRBC: 0 % (ref 0.0–0.2)

## 2019-08-30 LAB — COMPREHENSIVE METABOLIC PANEL
ALT: 41 U/L (ref 0–44)
AST: 50 U/L — ABNORMAL HIGH (ref 15–41)
Albumin: 4.1 g/dL (ref 3.5–5.0)
Alkaline Phosphatase: 59 U/L (ref 38–126)
Anion gap: 9 (ref 5–15)
BUN: 28 mg/dL — ABNORMAL HIGH (ref 8–23)
CO2: 26 mmol/L (ref 22–32)
Calcium: 9.2 mg/dL (ref 8.9–10.3)
Chloride: 103 mmol/L (ref 98–111)
Creatinine, Ser: 1.33 mg/dL — ABNORMAL HIGH (ref 0.61–1.24)
GFR calc Af Amer: >60 mL/min (ref >60)
GFR calc non Af Amer: 57 mL/min — ABNORMAL LOW (ref >60)
Glucose, Bld: 109 mg/dL — ABNORMAL HIGH (ref 70–99)
Potassium: 4.1 mmol/L (ref 3.5–5.1)
Sodium: 138 mmol/L (ref 135–145)
Total Bilirubin: 0.8 mg/dL (ref 0.3–1.2)
Total Protein: 7.1 g/dL (ref 6.5–8.1)

## 2019-08-30 LAB — TROPONIN I (HIGH SENSITIVITY)
Troponin I (High Sensitivity): 170 ng/L (ref <18)
Troponin I (High Sensitivity): 137 ng/L (ref <18)
Troponin I (High Sensitivity): 102 ng/L (ref <18)

## 2019-08-30 LAB — BLOOD GAS, VENOUS
Acid-Base Excess: 5.3 mmol/L — ABNORMAL HIGH (ref 0.0–2.0)
Bicarbonate: 31.9 mmol/L — ABNORMAL HIGH (ref 20.0–28.0)
O2 Saturation: 84.9 %
Patient temperature: 37
pCO2, Ven: 54 mmHg (ref 44.0–60.0)
pH, Ven: 7.38 (ref 7.250–7.430)
pO2, Ven: 51 mmHg — ABNORMAL HIGH (ref 32.0–45.0)

## 2019-08-30 LAB — RESPIRATORY PANEL BY RT PCR (FLU A&B, COVID)
Influenza A by PCR: NEGATIVE
Influenza B by PCR: NEGATIVE
SARS Coronavirus 2 by RT PCR: NEGATIVE

## 2019-08-30 LAB — URINE DRUG SCREEN, QUALITATIVE (ARMC ONLY)
Amphetamines, Ur Screen: NOT DETECTED
Barbiturates, Ur Screen: NOT DETECTED
Benzodiazepine, Ur Scrn: NOT DETECTED
Cannabinoid 50 Ng, Ur ~~LOC~~: NOT DETECTED
Cocaine Metabolite,Ur ~~LOC~~: NOT DETECTED
MDMA (Ecstasy)Ur Screen: NOT DETECTED
Methadone Scn, Ur: NOT DETECTED
Opiate, Ur Screen: NOT DETECTED
Phencyclidine (PCP) Ur S: NOT DETECTED
Tricyclic, Ur Screen: NOT DETECTED

## 2019-08-30 LAB — HIV ANTIBODY (ROUTINE TESTING W REFLEX): HIV Screen 4th Generation wRfx: NONREACTIVE

## 2019-08-30 LAB — APTT: aPTT: 26 seconds (ref 24–36)

## 2019-08-30 LAB — FIBRIN DERIVATIVES D-DIMER (ARMC ONLY): Fibrin derivatives D-dimer (ARMC): 542.52 ng/mL (FEU) — ABNORMAL HIGH (ref 0.00–499.00)

## 2019-08-30 LAB — GLUCOSE, CAPILLARY: Glucose-Capillary: 149 mg/dL — ABNORMAL HIGH (ref 70–99)

## 2019-08-30 LAB — BRAIN NATRIURETIC PEPTIDE: B Natriuretic Peptide: 3984 pg/mL — ABNORMAL HIGH (ref 0.0–100.0)

## 2019-08-30 MED ORDER — ATORVASTATIN CALCIUM 20 MG PO TABS
10.0000 mg | ORAL_TABLET | Freq: Every day | ORAL | Status: DC
  Administered 2019-08-30: 17:00:00 10 mg via ORAL
  Filled 2019-08-30: qty 1

## 2019-08-30 MED ORDER — DM-GUAIFENESIN ER 30-600 MG PO TB12
1.0000 | ORAL_TABLET | Freq: Two times a day (BID) | ORAL | Status: DC
  Filled 2019-08-30: qty 1

## 2019-08-30 MED ORDER — IPRATROPIUM-ALBUTEROL 0.5-2.5 (3) MG/3ML IN SOLN
3.0000 mL | Freq: Once | RESPIRATORY_TRACT | Status: AC
  Administered 2019-08-30: 3 mL via RESPIRATORY_TRACT
  Filled 2019-08-30: qty 3

## 2019-08-30 MED ORDER — SACUBITRIL-VALSARTAN 24-26 MG PO TABS
1.0000 | ORAL_TABLET | Freq: Two times a day (BID) | ORAL | Status: DC
  Filled 2019-08-30: qty 1

## 2019-08-30 MED ORDER — IOHEXOL 350 MG/ML SOLN
75.0000 mL | Freq: Once | INTRAVENOUS | Status: AC | PRN
  Administered 2019-08-30: 75 mL via INTRAVENOUS

## 2019-08-30 MED ORDER — FUROSEMIDE 10 MG/ML IJ SOLN: 40.0000 mg | Freq: Every day | INTRAMUSCULAR | Status: DC

## 2019-08-30 MED ORDER — METOPROLOL SUCCINATE ER 50 MG PO TB24
25.0000 mg | ORAL_TABLET | Freq: Every day | ORAL | Status: DC
  Administered 2019-08-30: 17:00:00 25 mg via ORAL
  Filled 2019-08-30: qty 1

## 2019-08-30 MED ORDER — SODIUM CHLORIDE 0.9% FLUSH
3.0000 mL | Freq: Once | INTRAVENOUS | Status: AC
  Administered 2019-08-30: 3 mL via INTRAVENOUS

## 2019-08-30 MED ORDER — IPRATROPIUM BROMIDE HFA 17 MCG/ACT IN AERS: 2.0000 | INHALATION_SPRAY | RESPIRATORY_TRACT | Status: DC

## 2019-08-30 MED ORDER — ACETAMINOPHEN 325 MG PO TABS: 650.0000 mg | ORAL_TABLET | Freq: Four times a day (QID) | ORAL | Status: DC | PRN

## 2019-08-30 MED ORDER — HEPARIN BOLUS VIA INFUSION
5000.0000 [IU] | Freq: Once | INTRAVENOUS | Status: AC
  Administered 2019-08-30: 14:00:00 5000 [IU] via INTRAVENOUS
  Filled 2019-08-30: qty 5000

## 2019-08-30 MED ORDER — IPRATROPIUM BROMIDE 0.02 % IN SOLN
0.5000 mg | RESPIRATORY_TRACT | Status: DC
  Administered 2019-08-30: 17:00:00 0.5 mg via RESPIRATORY_TRACT
  Filled 2019-08-30: qty 2.5

## 2019-08-30 MED ORDER — ASPIRIN EC 81 MG PO TBEC
81.0000 mg | DELAYED_RELEASE_TABLET | Freq: Every day | ORAL | Status: DC
  Administered 2019-08-30: 81 mg via ORAL
  Filled 2019-08-30: qty 1

## 2019-08-30 MED ORDER — NITROGLYCERIN 0.4 MG SL SUBL: 0.4000 mg | SUBLINGUAL_TABLET | SUBLINGUAL | Status: DC | PRN

## 2019-08-30 MED ORDER — METHYLPREDNISOLONE SODIUM SUCC 40 MG IJ SOLR: 40.0000 mg | Freq: Two times a day (BID) | INTRAMUSCULAR | Status: DC

## 2019-08-30 MED ORDER — FUROSEMIDE 10 MG/ML IJ SOLN
40.0000 mg | Freq: Once | INTRAMUSCULAR | Status: DC
  Filled 2019-08-30: qty 4

## 2019-08-30 MED ORDER — UMECLIDINIUM BROMIDE 62.5 MCG/INH IN AEPB
1.0000 | INHALATION_SPRAY | Freq: Every day | RESPIRATORY_TRACT | Status: DC
  Filled 2019-08-30: qty 7

## 2019-08-30 MED ORDER — IPRATROPIUM-ALBUTEROL 0.5-2.5 (3) MG/3ML IN SOLN
3.0000 mL | Freq: Once | RESPIRATORY_TRACT | Status: AC
  Administered 2019-08-30: 3 mL via RESPIRATORY_TRACT

## 2019-08-30 MED ORDER — INSULIN ASPART 100 UNIT/ML ~~LOC~~ SOLN: 0.0000 [IU] | Freq: Every day | SUBCUTANEOUS | Status: DC

## 2019-08-30 MED ORDER — FLUTICASONE-UMECLIDIN-VILANT 100-62.5-25 MCG/INH IN AEPB: 1.0000 | INHALATION_SPRAY | Freq: Every day | RESPIRATORY_TRACT | Status: DC

## 2019-08-30 MED ORDER — ALBUTEROL SULFATE (2.5 MG/3ML) 0.083% IN NEBU: 3.0000 mL | INHALATION_SOLUTION | RESPIRATORY_TRACT | Status: DC | PRN

## 2019-08-30 MED ORDER — FLUTICASONE FUROATE-VILANTEROL 100-25 MCG/INH IN AEPB
1.0000 | INHALATION_SPRAY | Freq: Every day | RESPIRATORY_TRACT | Status: DC
  Filled 2019-08-30: qty 28

## 2019-08-30 MED ORDER — MORPHINE SULFATE (PF) 2 MG/ML IV SOLN: 1.0000 mg | INTRAVENOUS | Status: DC | PRN

## 2019-08-30 MED ORDER — NICOTINE 21 MG/24HR TD PT24
21.0000 mg | MEDICATED_PATCH | Freq: Every day | TRANSDERMAL | Status: DC
  Administered 2019-08-30: 21 mg via TRANSDERMAL
  Filled 2019-08-30: qty 1

## 2019-08-30 MED ORDER — METHYLPREDNISOLONE SODIUM SUCC 125 MG IJ SOLR
125.0000 mg | Freq: Once | INTRAMUSCULAR | Status: AC
  Administered 2019-08-30: 11:00:00 125 mg via INTRAVENOUS
  Filled 2019-08-30: qty 2

## 2019-08-30 MED ORDER — HEPARIN (PORCINE) 25000 UT/250ML-% IV SOLN
1400.0000 [IU]/h | INTRAVENOUS | Status: DC
  Administered 2019-08-30: 1400 [IU]/h via INTRAVENOUS
  Filled 2019-08-30: qty 250

## 2019-08-30 MED ORDER — INSULIN ASPART 100 UNIT/ML ~~LOC~~ SOLN: 0.0000 [IU] | Freq: Three times a day (TID) | SUBCUTANEOUS | Status: DC

## 2019-08-30 NOTE — ED Notes (Addendum)
IV removed, pt encouraged to stay, states he will go somewhere else that isn't busy. Pt informed that due to heparin that he is at risk for bleeding. Pt states understanding. Pharmacy called about inhalers.

## 2019-08-30 NOTE — ED Triage Notes (Signed)
Pt started acutely with [facility] and left side sharp chest pain this AM.  When asked pt is on eliquis, has missed doses, hx dvt

## 2019-08-30 NOTE — Consult Note (Signed)
ANTICOAGULATION CONSULT NOTE  Pharmacy Consult for heparin infusion Indication: pulmonary embolus  No Known Allergies  Patient Measurements: Height: 5\' 11"  (180.3 cm) Weight: 81.6 kg (180 lb) IBW/kg (Calculated) : 75.3  Vital Signs: Temp: 96.4 F (35.8 C) (04/08 1042) Temp Source: Axillary (04/08 1042) BP: 114/91 (04/08 1230) Pulse Rate: 87 (04/08 1230)  Labs: Recent Labs    08/30/19 1035  HGB 15.1  HCT 45.2  PLT 309  CREATININE 1.33*  TROPONINIHS 170*    Estimated Creatinine Clearance: 61.3 mL/min (A) (by C-G formula based on SCr of 1.33 mg/dL (H)).   Medical History: Past Medical History:  Diagnosis Date  . Asthma   . CHF (congestive heart failure) (HCC)   . COPD (chronic obstructive pulmonary disease) (HCC)   . Coronary artery disease   . Diabetes mellitus without complication [facility])     Assessment: 64 y.o. male with history of CHF, COPD, diabetes, here with shortness of breath. He is on apixaban PTA for a recent DVT although he is noted to have not been taking it as prescribed; initial D-Dimer 542.5, HS troponin 170  Goal of Therapy:  Heparin level 0.3-0.7 units/ml aPTT 66 - 102s Monitor platelets by anticoagulation protocol: Yes   Plan:  Give 5000 units bolus x 1 Start heparin infusion at 1400 units/hr Check anti-Xa level in 6 hours and daily while on heparin Continue to monitor H&H and platelets  70 08/30/2019,12:38 PM

## 2019-08-30 NOTE — ED Notes (Signed)
Hold lasix per EDP due to pt's current bp and urine output. Pt sleeping at present. Pt refused to allow covid swab to be put in back of nasal passage. Lab notified.

## 2019-08-30 NOTE — ED Notes (Signed)
Pt found OOB with monitors removed. Pt states he wants his inhalers so he can leave. Pt ate dinner, 100%. Pt stating he wants to go home because ED is too busy. Pt told that this RN will speak with MD as soon as able.

## 2019-08-30 NOTE — H&P (Addendum)
History and Physical    Scott Mcknight. EXH:[id number] DOB: 10-11-1955 DOA: 08/30/2019  Referring MD/NP/PA:   PCP: Patient, No Pcp Per   Patient coming from:  The patient is coming from home.  At baseline, pt is independent for most of ADL.        Chief Complaint: Shortness of breath and chest pain  HPI: Scott Mcknight. is a 64 y.o. male with medical history significant of DVT on Eliquis (not compliant), diabetes mellitus, COPD, HLD, asthma, CAD, dCHF, tobacco abuse, who presents with shortness breath and chest pain.  Patient states that his shortness breath and chest pain started this morning.  His symptoms have been progressively worsening.  The chest pain is located in the left central chest, 5 out of 10 severity, sharp, pleuritic, aggravated by deep breath.  He has dry cough, but no fever or chills. Pt is on Eliquis for DVT, but the patient has not been taking his medication consistently.  Patient does not have nausea, vomiting, diarrhea, abdominal pain, symptoms of UTI or unilateral weakness. Initially, patient had significant respiratory distress, requiring BiPAP. Patient gradually improved in ED with BiPAP. He is off BiPAP, with oxygen saturation 99% on 2 L nasal cannula oxygen when I saw pt in ED.  ED Course: pt was found to have troponin 170 --> 137, BNP 3984, pending COVID-19 PCR, positive D-dimer 540, stable renal function, blood pressure 114/74, tachycardia, tachypnea, temperature 96.4, chest x-ray showed mild CHF. CTA negative for PE. Pt is admitted to progressive unit as inpatient.  Review of Systems:   General: no fevers, chills, no body weight gain, has poor appetite, has fatigue HEENT: no blurry vision, hearing changes or sore throat Respiratory: has dyspnea, coughing, wheezing CV: no chest pain, no palpitations GI: no nausea, vomiting, abdominal pain, diarrhea, constipation GU: no dysuria, burning on urination, increased urinary frequency, hematuria  Ext: has trace leg  edema Neuro: no unilateral weakness, numbness, or tingling, no vision change or hearing loss Skin: no rash, no skin tear. MSK: No muscle spasm, no deformity, no limitation of range of movement in spin Heme: No easy bruising.  Travel history: No recent long distant travel.  Allergy: No Known Allergies  Past Medical History:  Diagnosis Date  . Asthma   . CHF (congestive heart failure) ([place])   . COPD (chronic obstructive pulmonary disease) ([place])   . Coronary artery disease   . Diabetes mellitus without complication ([place])     No past surgical history on file.  Social History:  reports that he has been smoking cigarettes. He has been smoking about 0.50 packs per day. He has never used smokeless tobacco. He reports current alcohol use. He reports that he does not use drugs.  Family History: Reviewed with patient, but patient states that all family members are healthy, without significant medical issues.  Prior to Admission medications   Medication Sig Start Date End Date Taking? Authorizing Provider  albuterol (VENTOLIN HFA) 108 (90 Base) MCG/ACT inhaler Inhale 2 puffs into the lungs every 6 (six) hours as needed for wheezing or shortness of breath. 07/05/19   Arta Silence, MD    Physical Exam: Vitals:   08/30/19 1633 08/30/19 1636 08/30/19 1700 08/30/19 1730  BP: (!) 115/92  106/90 (!) 106/94  Pulse: 85 83 85 83  Resp: (!) 23 18 17 20   Temp:      TempSrc:      SpO2: 93% 97% 97% 96%  Weight:      Height:  General: Not in acute distress HEENT:       Eyes: PERRL, EOMI, no scleral icterus.       ENT: No discharge from the ears and nose, no pharynx injection, no tonsillar enlargement.        Neck: No JVD, no bruit, no mass felt. Heme: No neck lymph node enlargement. Cardiac: S1/S2, RRR, No murmurs, No gallops or rubs. Respiratory: Diffuse wheezing bilaterally GI: Soft, nondistended, nontender, no rebound pain, no organomegaly, BS present. GU: No hematuria Ext: has  trace leg edema bilaterally. 2+DP/PT pulse bilaterally. Musculoskeletal: No joint deformities, No joint redness or warmth, no limitation of ROM in spin. Skin: No rashes.  Neuro: Alert, oriented X3, cranial nerves II-XII grossly intact, moves all extremities normally.  Psych: Patient is not psychotic, no suicidal or hemocidal ideation.  Labs on Admission: I have personally reviewed following labs and imaging studies  CBC: Recent Labs  Lab 08/30/19 1035  WBC 5.4  HGB 15.1  HCT 45.2  MCV 95.2  PLT 309   Basic Metabolic Panel: Recent Labs  Lab 08/30/19 1035  NA 138  K 4.1  CL 103  CO2 26  GLUCOSE 109*  BUN 28*  CREATININE 1.33*  CALCIUM 9.2   GFR: Estimated Creatinine Clearance: 61.3 mL/min (A) (by C-G formula based on SCr of 1.33 mg/dL (H)). Liver Function Tests: Recent Labs  Lab 08/30/19 1035  AST 50*  ALT 41  ALKPHOS 59  BILITOT 0.8  PROT 7.1  ALBUMIN 4.1   No results for input(s): LIPASE, AMYLASE in the last 168 hours. No results for input(s): AMMONIA in the last 168 hours. Coagulation Profile: No results for input(s): INR, PROTIME in the last 168 hours. Cardiac Enzymes: No results for input(s): CKTOTAL, CKMB, CKMBINDEX, TROPONINI in the last 168 hours. BNP (last 3 results) No results for input(s): PROBNP in the last 8760 hours. HbA1C: No results for input(s): HGBA1C in the last 72 hours. CBG: No results for input(s): GLUCAP in the last 168 hours. Lipid Profile: No results for input(s): CHOL, HDL, LDLCALC, TRIG, CHOLHDL, LDLDIRECT in the last 72 hours. Thyroid Function Tests: No results for input(s): TSH, T4TOTAL, FREET4, T3FREE, THYROIDAB in the last 72 hours. Anemia Panel: No results for input(s): VITAMINB12, FOLATE, FERRITIN, TIBC, IRON, RETICCTPCT in the last 72 hours. Urine analysis: No results found for: COLORURINE, APPEARANCEUR, LABSPEC, PHURINE, GLUCOSEU, HGBUR, BILIRUBINUR, KETONESUR, PROTEINUR, UROBILINOGEN, NITRITE, LEUKOCYTESUR Sepsis  Labs: @LABRCNTIP (procalcitonin:4,lacticidven:4) ) Recent Results (from the past 240 hour(s))  Respiratory Panel by RT PCR (Flu A&B, Covid) - Nasopharyngeal Swab     Status: None   Collection Time: 08/30/19 12:51 PM   Specimen: Nasopharyngeal Swab  Result Value Ref Range Status   SARS Coronavirus 2 by RT PCR NEGATIVE NEGATIVE Final    Comment: (NOTE) SARS-CoV-2 target nucleic acids are NOT DETECTED. The SARS-CoV-2 RNA is generally detectable in upper respiratoy specimens during the acute phase of infection. The lowest concentration of SARS-CoV-2 viral copies this assay can detect is 131 copies/mL. A negative result does not preclude SARS-Cov-2 infection and should not be used as the sole basis for treatment or other patient management decisions. A negative result may occur with  improper specimen collection/handling, submission of specimen other than nasopharyngeal swab, presence of viral mutation(s) within the areas targeted by this assay, and inadequate number of viral copies (<131 copies/mL). A negative result must be combined with clinical observations, patient history, and epidemiological information. The expected result is Negative. Fact Sheet for Patients:  10/30/19 Fact Sheet  for Healthcare Providers:  https://www.young.biz/ This test is not yet ap proved or cleared by the [place] and  has been authorized for detection and/or diagnosis of SARS-CoV-2 by FDA under an Emergency Use Authorization (EUA). This EUA will remain  in effect (meaning this test can be used) for the duration of the COVID-19 declaration under Section 564(b)(1) of the Act, 21 U.S.C. section 360bbb-3(b)(1), unless the authorization is terminated or revoked sooner.    Influenza A by PCR NEGATIVE NEGATIVE Final   Influenza B by PCR NEGATIVE NEGATIVE Final    Comment: (NOTE) The Xpert Xpress SARS-CoV-2/FLU/RSV assay is intended as an aid in  the  diagnosis of influenza from Nasopharyngeal swab specimens and  should not be used as a sole basis for treatment. Nasal washings and  aspirates are unacceptable for Xpert Xpress SARS-CoV-2/FLU/RSV  testing. Fact Sheet for Patients: https://www.moore.com/ Fact Sheet for Healthcare Providers: https://www.young.biz/ This test is not yet approved or cleared by the [place] FDA and  has been authorized for detection and/or diagnosis of SARS-CoV-2 by  FDA under an Emergency Use Authorization (EUA). This EUA will remain  in effect (meaning this test can be used) for the duration of the  Covid-19 declaration under Section 564(b)(1) of the Act, 21  U.S.C. section 360bbb-3(b)(1), unless the authorization is  terminated or revoked. Performed at [facility], [address] [place], [place] [address]      Radiological Exams on Admission: CT ANGIO CHEST PE W OR WO CONTRAST  Result Date: 08/30/2019 CLINICAL DATA:  Shortness of breath EXAM: CT ANGIOGRAPHY CHEST WITH CONTRAST TECHNIQUE: Multidetector CT imaging of the chest was performed using the standard protocol during bolus administration of intravenous contrast. Multiplanar CT image reconstructions and MIPs were obtained to evaluate the vascular anatomy. CONTRAST:  20mL OMNIPAQUE IOHEXOL 350 MG/ML SOLN COMPARISON:  08/19/2019 FINDINGS: Cardiovascular: No filling defects in the pulmonary arteries to suggest pulmonary emboli. Cardiomegaly. Aorta is normal caliber. Mediastinum/Nodes: No mediastinal, hilar, or axillary adenopathy. Trachea and esophagus are unremarkable. Thyroid unremarkable. Lungs/Pleura: Lungs are clear. No focal airspace opacities or suspicious nodules. No effusions. Upper Abdomen: Imaging into the upper abdomen shows no acute findings. Musculoskeletal: Chest wall soft tissues are unremarkable. No acute bony abnormality. Review of the MIP images confirms the above findings. IMPRESSION: No  evidence of pulmonary embolus. Cardiomegaly. No acute cardiopulmonary disease. Electronically Signed   By: Charlett Nose M.D.   On: 08/30/2019 15:05   DG Chest Portable 1 View  Result Date: 08/30/2019 CLINICAL DATA:  Shortness of breath EXAM: PORTABLE CHEST 1 VIEW COMPARISON:  08/19/2019 FINDINGS: Cardiac shadow is mildly enlarged but stable. The lungs are well aerated. Mild vascular congestion is seen with slight increase in interstitial edema. No sizable effusion is noted. No focal infiltrate is seen. IMPRESSION: Changes of mild CHF Electronically Signed   By: Alcide Clever M.D.   On: 08/30/2019 11:32     EKG: Independently reviewed.  Sinus rhythm, QTC 529, frequent PVC, poor R wave progression, LAE.  Assessment/Plan Principal Problem:   Acute on chronic respiratory failure with hypoxia (HCC) Active Problems:   Type II diabetes mellitus with renal manifestations (HCC)   Coronary artery disease   COPD exacerbation (HCC)   Acute on chronic systolic CHF (congestive heart failure) (HCC)   DVT (deep venous thrombosis) (HCC)   Tobacco abuse   Hypothermia   Elevated troponin   Chest pain   HLD (hyperlipidemia)   Acute on chronic respiratory failure with hypoxia (HCC):  Most likely due to combination of CHF exacerbation and COPD exacerbation.  Patient has wheezing on auscultation indicating COPD exacerbation.  Patient has elevated BNP, pulmonary edema on chest x-ray, clinically consistent with CHF exacerbation.  -Admit to progressive unit as inpatient -Treat CHF and COPD as below -Bronchodilators, -Nasal cannula oxygen to maintain oxygen saturation above 94%  COPD exacerbation: -Bronchodilators -Solu-Medrol 40 mg twice daily -As needed Mucinex  Acute on chronic systolic CHF: 2D echo on 05/07/2019 showed LVEF 15%. -Lasix 40 mg daily -Continue spironolactone -2D echo -Entresto and metoprolol  Type II diabetes mellitus with renal manifestations (HCC): Most recent A1c is not on record.  Patient is taking [place] at home -SSI  DVT (deep venous thrombosis) (HCC): -Eluquis (before CTA result is back, pt was started with IV heparin,which is d/c'ed)  Tobacco abuse -nicotine patch  Hypothermia -Bair hugg  Elevated troponin and CP: Troponin 170 -->137.  CT angiogram is negative for PE.  Most likely due to demand ischemia secondary to CHF exacerbation -As needed morphine and NTG for pain -Check A1c, FLP, UDS -Trend troponin -Repeat EKG in morning -Follow-up 2D echo -Start aspirin -Lipitor and   HLD: -Lipitor   Inpatient status:  # Patient requires inpatient status due to high intensity of service, high risk for further deterioration and high frequency of surveillance required.  I certify that at the point of admission it is my clinical judgment that the patient will require inpatient hospital care spanning beyond 2 midnights from the point of admission.  . This patient has multiple chronic comorbidities including DVT on Eliquis (not compliant), diabetes mellitus, COPD, asthma, CAD, dCHF, tobacco abuse.  . Now patient has presenting with acute on chronic respiratory failure with hypoxia due to CHF and COPD exacerbation . The worrisome physical exam findings include wheezing on auscultation, lower leg edema . The initial radiographic and laboratory data are worrisome because of elevated troponin, elevated BNP, positive D-dimer . Current medical needs: please see my assessment and plan . Predictability of an adverse outcome (risk): Patient has multiple comorbidities as listed above. Now presents with acute on chronic respiratory failure with hypoxia due to CHF and COPD exacerbation. Patient's presentation is highly complicated.  Patient is at high risk of deteriorating.  Will need to be treated in hospital for at least 2 days.       DVT ppx: on IV Heparin -->changed to Eliquis for DVT Code Status: Full code Family Communication: not done, no family member is at bed  side.    Disposition Plan:  Anticipate discharge back to previous home environment Consults called:  none Admission status: Progressive plan as inpatient  Date of Service 08/30/2019    Lorretta Harp [facility]   If 7PM-7AM, please contact night-coverage [email or web address] 08/30/2019, 7:03 PM

## 2019-08-30 NOTE — ED Provider Notes (Signed)
[facility] Emergency Department Provider Note  ____________________________________________   First MD Initiated Contact with Patient 08/30/19 1038     (approximate)  I have reviewed the triage vital signs and the nursing notes.   HISTORY  Chief Complaint Chest Pain    HPI Scott Mcknight. is a 64 y.o. male with history of CHF, COPD, diabetes, here with shortness of breath. The patient states that he woke up this morning and began to feel acutely and progressively more severely short of breath. He states he feels like he cannot get enough air in or out. He has had associated difficulty even getting around his house. He tried using a home albuterol inhaler without significant relief. He states he had felt well yesterday. Denies any recent fevers or chills. Does endorse recent allergies due to pollen. He has a history of COPD exacerbations that feels somewhat similar to this. He also notes that he has a history of DVT and has not been taking his Eliquis as prescribed. Denies any leg swelling, however. Denies known history of PE. He does describe a sharp, left-sided, pleuritic chest pain that is worse with inspiration. This began after coughing and his increased work of breathing began this morning.  No hemoptysis.     Past Medical History:  Diagnosis Date  . Asthma   . CHF (congestive heart failure) (HCC)   . COPD (chronic obstructive pulmonary disease) (HCC)   . Coronary artery disease   . Diabetes mellitus without complication (HCC)     There are no problems to display for this patient.   No past surgical history on file.  Prior to Admission medications   Medication Sig Start Date End Date Taking? Authorizing Provider  albuterol (VENTOLIN HFA) 108 (90 Base) MCG/ACT inhaler Inhale 2 puffs into the lungs every 6 (six) hours as needed for wheezing or shortness of breath. 07/05/19   Dionne Bucy, MD    Allergies Patient has no known  allergies.  No family history on file.  Social History Social History   Tobacco Use  . Smoking status: Current Every Day Smoker    Packs/day: 0.50    Types: Cigarettes  . Smokeless tobacco: Never Used  Substance Use Topics  . Alcohol use: Yes    Comment: occasionally  . Drug use: Never    Review of Systems  Review of Systems  Constitutional: Positive for fatigue. Negative for chills and fever.  HENT: Negative for sore throat.   Respiratory: Positive for cough and shortness of breath.   Cardiovascular: Negative for chest pain.  Gastrointestinal: Positive for nausea. Negative for abdominal pain.  Genitourinary: Negative for flank pain.  Musculoskeletal: Negative for neck pain.  Skin: Negative for rash and wound.  Allergic/Immunologic: Negative for immunocompromised state.  Neurological: Positive for weakness. Negative for numbness.  Hematological: Does not bruise/bleed easily.  All other systems reviewed and are negative.    ____________________________________________  PHYSICAL EXAM:      VITAL SIGNS: ED Triage Vitals  Enc Vitals Group     BP 08/30/19 1042 (!) 149/122     Pulse Rate 08/30/19 1042 (!) 108     Resp 08/30/19 1042 (!) 32     Temp 08/30/19 1042 (!) 96.4 F (35.8 C)     Temp Source 08/30/19 1042 Axillary     SpO2 08/30/19 1042 96 %     Weight 08/30/19 1040 180 lb (81.6 kg)     Height 08/30/19 1040 5\' 11"  (1.803 m)  Head Circumference --      Peak Flow --      Pain Score 08/30/19 1038 9     Pain Loc --      Pain Edu? --      Excl. in GC? --      Physical Exam Vitals and nursing note reviewed.  Constitutional:      General: He is not in acute distress.    Appearance: He is well-developed. He is ill-appearing and diaphoretic.  HENT:     Head: Normocephalic and atraumatic.  Eyes:     Conjunctiva/sclera: Conjunctivae normal.  Cardiovascular:     Rate and Rhythm: Regular rhythm. Tachycardia present.     Heart sounds: Normal heart sounds.   Pulmonary:     Effort: Pulmonary effort is normal. Tachypnea present. No respiratory distress.     Breath sounds: Examination of the right-lower field reveals rales. Examination of the left-lower field reveals rales. Decreased breath sounds, wheezing and rales present.  Abdominal:     General: There is no distension.  Musculoskeletal:     Cervical back: Neck supple.     Right lower leg: Edema present.     Left lower leg: Edema present.  Skin:    General: Skin is warm.     Capillary Refill: Capillary refill takes less than 2 seconds.     Findings: No rash.  Neurological:     Mental Status: He is alert and oriented to person, place, and time.     Motor: No abnormal muscle tone.       ____________________________________________   LABS (all labs ordered are listed, but only abnormal results are displayed)  Labs Reviewed  COMPREHENSIVE METABOLIC PANEL - Abnormal; Notable for the following components:      Result Value   Glucose, Bld 109 (*)    BUN 28 (*)    Creatinine, Ser 1.33 (*)    AST 50 (*)    GFR calc non Af Amer 57 (*)    All other components within normal limits  BRAIN NATRIURETIC PEPTIDE - Abnormal; Notable for the following components:   B Natriuretic Peptide 3,984.0 (*)    All other components within normal limits  FIBRIN DERIVATIVES D-DIMER ([facility] ONLY) - Abnormal; Notable for the following components:   Fibrin derivatives D-dimer ([facility]) 542.52 (*)    All other components within normal limits  BLOOD GAS, VENOUS - Abnormal; Notable for the following components:   pO2, Ven 51.0 (*)    Bicarbonate 31.9 (*)    Acid-Base Excess 5.3 (*)    All other components within normal limits  TROPONIN I (HIGH SENSITIVITY) - Abnormal; Notable for the following components:   Troponin I (High Sensitivity) 170 (*)    All other components within normal limits  CBC    ____________________________________________  EKG: Sinus tachycardia, ventricular rate 102.  QRS 146, QTc 529.   Nonspecific T wave changes diffusely, possibly ischemic though largely unchanged from prior.  PVC noted. ________________________________________  RADIOLOGY All imaging, including plain films, CT scans, and ultrasounds, independently reviewed by me, and interpretations confirmed via formal radiology reads.  ED MD interpretation:   Chest x-ray: Mild CHF  Official radiology report(s): DG Chest Portable 1 View  Result Date: 08/30/2019 CLINICAL DATA:  Shortness of breath EXAM: PORTABLE CHEST 1 VIEW COMPARISON:  08/19/2019 FINDINGS: Cardiac shadow is mildly enlarged but stable. The lungs are well aerated. Mild vascular congestion is seen with slight increase in interstitial edema. No sizable effusion is noted. No focal  infiltrate is seen. IMPRESSION: Changes of mild CHF Electronically Signed   By: Alcide Clever M.D.   On: 08/30/2019 11:32    ____________________________________________  PROCEDURES   Procedure(s) performed (including Critical Care):  .Critical Care Performed by: Shaune Pollack, MD Authorized by: Shaune Pollack, MD   Critical care provider statement:    Critical care time (minutes):  35   Critical care time was exclusive of:  Separately billable procedures and treating other patients and teaching time   Critical care was necessary to treat or prevent imminent or life-threatening deterioration of the following conditions:  Cardiac failure, circulatory failure and sepsis   Critical care was time spent personally by me on the following activities:  Development of treatment plan with patient or surrogate, discussions with consultants, evaluation of patient's response to treatment, examination of patient, obtaining history from patient or surrogate, ordering and performing treatments and interventions, ordering and review of laboratory studies, ordering and review of radiographic studies, pulse oximetry, re-evaluation of patient's condition and review of old charts   I assumed  direction of critical care for this patient from another provider in my specialty: no   .1-3 Lead EKG Interpretation Performed by: Shaune Pollack, MD Authorized by: Shaune Pollack, MD     Interpretation: normal     ECG rate assessment: normal     Rhythm: sinus rhythm     Ectopy: none     Conduction: normal   Comments:     Indication: Acute heart failure    ____________________________________________  INITIAL IMPRESSION / MDM / ASSESSMENT AND PLAN / ED COURSE  As part of my medical decision making, I reviewed the following data within the electronic MEDICAL RECORD NUMBER Nursing notes reviewed and incorporated, Old chart reviewed, Notes from prior ED visits, and [place] Controlled Substance Database       *Blaike Ithiel Liebler. was evaluated in Emergency Department on 08/30/2019 for the symptoms described in the history of present illness. He was evaluated in the context of the global COVID-19 pandemic, which necessitated consideration that the patient might be at risk for infection with the SARS-CoV-2 virus that causes COVID-19. Institutional protocols and algorithms that pertain to the evaluation of patients at risk for COVID-19 are in a state of rapid change based on information released by regulatory bodies including the CDC and federal and state organizations. These policies and algorithms were followed during the patient's care in the ED.  Some ED evaluations and interventions may be delayed as a result of limited staffing during the pandemic.*     Medical Decision Making: 64 year old male here with shortness of breath.  Patient markedly tachypneic and tripoding on arrival.  Suspect acute hypoxic respiratory distress secondary to COPD with additional component of flash edema today.  Patient initially started on BiPAP, given multiple nebs, with dramatic improvement.  He is significantly anxious on BiPAP, so was transitioned to nasal cannula and is tolerating this well.  Lab work shows mild troponin  elevation, elevated BNP consistent with likely demand from CHF.  He has also had a good response to nebulizer treatments so will continue with this.  Admit to medicine.  ____________________________________________  FINAL CLINICAL IMPRESSION(S) / ED DIAGNOSES  Final diagnoses:  None     MEDICATIONS GIVEN DURING THIS VISIT:  Medications  furosemide (LASIX) injection 40 mg (has no administration in time range)  sodium chloride flush (NS) 0.9 % injection 3 mL (3 mLs Intravenous Given 08/30/19 1138)  ipratropium-albuterol (DUONEB) 0.5-2.5 (3) MG/3ML nebulizer solution 3 mL (  3 mLs Nebulization Given 08/30/19 1058)  ipratropium-albuterol (DUONEB) 0.5-2.5 (3) MG/3ML nebulizer solution 3 mL (3 mLs Nebulization Given 08/30/19 1058)  ipratropium-albuterol (DUONEB) 0.5-2.5 (3) MG/3ML nebulizer solution 3 mL (3 mLs Nebulization Given 08/30/19 1057)  methylPREDNISolone sodium succinate (SOLU-MEDROL) 125 mg/2 mL injection 125 mg (125 mg Intravenous Given 08/30/19 1102)     ED Discharge Orders    None       Note:  This document was prepared using Dragon voice recognition software and may include unintentional dictation errors.   Duffy Bruce, MD 08/30/19 1157

## 2019-08-30 NOTE — ED Notes (Signed)
EKG preformed

## 2019-08-30 NOTE — ED Notes (Signed)
2nd line attempted x2 without success, float  Nurse asked to attempt 2nd line. At 2nd attempt pt stated it hurt too much and to take it out. This RN took 2nd attempt at IV out. Pt with one IV access at this time.

## 2019-08-30 NOTE — ED Notes (Signed)
Pt refusing to stay. Pharmacy called about inhaler, pt encouraged to stay but wants to leave. MD aware

## 2019-08-30 NOTE — ED Notes (Signed)
Date and time results received: 08/30/19 1128 (use smartphrase ".now" to insert current time)  Test: trop Critical Value: 170  Name of Provider Notified: Isaacs  Orders Received? Or Actions Taken?: NA

## 2019-08-30 NOTE — Progress Notes (Signed)
Patient was unable to tolerate the BIPAP. BIPAP is at bedside.  Patient was given DUONEB x3; will reevaluate after breathing treatments.

## 2019-08-30 NOTE — ED Notes (Signed)
This RN notified MD that pt wants to leave

## 2019-08-30 NOTE — ED Notes (Signed)
Scott Mcknight to bedside to discuss risk of AMA by patient. Pt provides verbal understanding and agrees to sign AMA paperwork. Pt dressed and leaving ED with spouse.

## 2019-08-30 NOTE — ED Notes (Signed)
Pt to CT

## 2019-08-30 NOTE — ED Notes (Signed)
EDP in room. Pt states he feels like he is breathing better. Pt relaxed, pt with RR of 22. O2 sats 100 on 2L non rebreather. Pt placed on 2 L [place], tolerating well.

## 2019-08-30 NOTE — Discharge Summary (Signed)
Patient reported nurse he was feeling better and did not want to stay at hospital. Bedside patient without respiratory distress and stable on room air.  He states he got into difficulty breathing and chest pressure due to being without his usual inhaler for 2 days , being outside in air and smoking.  Despite recommendations he should be monitored for return of respiratory distress , he is adamant he want to sign out AMA.  Inhalers ere provided to him by ED and he agrees to follow up with his PCP in am and will return to [facility] should he develop respiratory distress again.

## 2019-08-31 DIAGNOSIS — J9601 Acute respiratory failure with hypoxia: Secondary | ICD-10-CM | POA: Insufficient documentation

## 2019-08-31 NOTE — Discharge Summary (Signed)
Physician Discharge Summary  Scott Mcknight. LZJ:[id number] DOB: [birth year] DOA: 08/30/2019  PCP: Patient, No Pcp Per  Admit date: 08/30/2019 Discharge date: 08/30/2019  Recommendations for Outpatient Follow-up:  -none, pt left on AMA  Home Health:none Equipment/Devices: none  Discharge Condition: clinically improved per report CODE STATUS: full code Diet recommendation: heart/carb modified diet  Brief/Interim Summary (HPI): Scott Mcknight. is a 64 y.o. male with medical history significant of DVT on Eliquis (not compliant), diabetes mellitus, COPD, HLD, asthma, CAD, dCHF, tobacco abuse, who presents with shortness breath and chest pain.  Patient states that his shortness breath and chest pain started this morning.  His symptoms have been progressively worsening.  The chest pain is located in the left central chest, 5 out of 10 severity, sharp, pleuritic, aggravated by deep breath.  He has dry cough, but no fever or chills. Pt is on Eliquis for DVT, but the patient has not been taking his medication consistently.  Patient does not have nausea, vomiting, diarrhea, abdominal pain, symptoms of UTI or unilateral weakness. Initially, patient had significant respiratory distress, requiring BiPAP. Patient gradually improved in ED with BiPAP. He is off BiPAP, with oxygen saturation 99% on 2 L nasal cannula oxygen when I saw pt in ED.  ED Course: pt was found to have troponin 170 --> 137, BNP 3984, pending COVID-19 PCR, positive D-dimer 540, stable renal function, blood pressure 114/74, tachycardia, tachypnea, temperature 96.4, chest x-ray showed mild CHF. CTA negative for PE. Pt is admitted to progressive unit as inpatient.  Subjective: On admisison, pt had shortness breath and chest pain.  Discharge Diagnoses and Hospital Course:   Principal Problem:   Acute on chronic respiratory failure with hypoxia (HCC) Active Problems:   Type II diabetes mellitus with renal manifestations (HCC)   Coronary  artery disease   COPD exacerbation (HCC)   Acute on chronic systolic CHF (congestive heart failure) (HCC)   DVT (deep venous thrombosis) (HCC)   Tobacco abuse   Hypothermia   Elevated troponin   Chest pain   HLD (hyperlipidemia)   Acute on chronic respiratory failure with hypoxia (HCC): Most likely due to combination of CHF exacerbation and COPD exacerbation.  Patient has wheezing on auscultation indicating COPD exacerbation.  Patient has elevated BNP, pulmonary edema on chest x-ray, clinically consistent with CHF exacerbation.  -Admit to progressive unit as inpatient -Treat CHF and COPD as below -Bronchodilators, -Nasal cannula oxygen to maintain oxygen saturation above 94% -Unfortunately patient left hospital AMA after initial treatment, and patient feels better per report.  COPD exacerbation: -Bronchodilators -Solu-Medrol 40 mg twice daily -As needed Mucinex  Acute on chronic systolic CHF: 2D echo on 05/07/2019 showed LVEF 15%. -Lasix 40 mg daily -Continue spironolactone -2D echo -Entresto and metoprolol  Type II diabetes mellitus with renal manifestations (HCC): Most recent A1c is not on record. Patient is taking [place] at home -SSI  DVT (deep venous thrombosis) (HCC): -Eluquis (before CTA result is back, pt was started with IV heparin,which is d/c'ed)  Tobacco abuse -nicotine patch  Hypothermia -Bair hugg  Elevated troponin and CP: Troponin 170 -->137.  CT angiogram is negative for PE.  Most likely due to demand ischemia secondary to CHF exacerbation -As needed morphine and NTG for pain -Check A1c, FLP, UDS -Trend troponin -Repeat EKG in morning -Follow-up 2D echo -Start aspirin -Lipitor   HLD: -Lipitor   Discharge Instructions -not given since pt left on AMA (I was not in hospital)   Allergies as of 08/30/2019  No Known Allergies     Medication List    ASK your doctor about these medications   albuterol 108 (90 Base) MCG/ACT  inhaler Commonly known as: VENTOLIN HFA Inhale 2 puffs into the lungs every 6 (six) hours as needed for wheezing or shortness of breath.   atorvastatin 10 MG tablet Commonly known as: LIPITOR Take 10 mg by mouth daily.   Eliquis 5 MG Tabs tablet Generic drug: apixaban Take 5 mg by mouth 2 (two) times daily.   Entresto 24-26 MG Generic drug: sacubitril-valsartan Take 1 tablet by mouth 2 (two) times daily.   Farxiga 10 MG Tabs tablet Generic drug: dapagliflozin propanediol Take 10 mg by mouth daily.   furosemide 20 MG tablet Commonly known as: LASIX Take 20 mg by mouth daily.   ipratropium-albuterol 0.5-2.5 (3) MG/3ML Soln Commonly known as: DUONEB Take 3 mLs by nebulization 4 (four) times daily as needed for wheezing or shortness of breath.   metoprolol succinate 25 MG 24 hr tablet Commonly known as: TOPROL-XL Take 25 mg by mouth daily.   spironolactone 25 MG tablet Commonly known as: ALDACTONE Take 12.5 mg by mouth daily.   Trelegy Ellipta 100-62.5-25 MCG/INH Aepb Generic drug: Fluticasone-Umeclidin-Vilant Take 1 puff by mouth daily.       No Known Allergies  Consultations:  none   Procedures/Studies: CT ANGIO CHEST PE W OR WO CONTRAST  Result Date: 08/30/2019 CLINICAL DATA:  Shortness of breath EXAM: CT ANGIOGRAPHY CHEST WITH CONTRAST TECHNIQUE: Multidetector CT imaging of the chest was performed using the standard protocol during bolus administration of intravenous contrast. Multiplanar CT image reconstructions and MIPs were obtained to evaluate the vascular anatomy. CONTRAST:  16mL OMNIPAQUE IOHEXOL 350 MG/ML SOLN COMPARISON:  08/19/2019 FINDINGS: Cardiovascular: No filling defects in the pulmonary arteries to suggest pulmonary emboli. Cardiomegaly. Aorta is normal caliber. Mediastinum/Nodes: No mediastinal, hilar, or axillary adenopathy. Trachea and esophagus are unremarkable. Thyroid unremarkable. Lungs/Pleura: Lungs are clear. No focal airspace opacities or  suspicious nodules. No effusions. Upper Abdomen: Imaging into the upper abdomen shows no acute findings. Musculoskeletal: Chest wall soft tissues are unremarkable. No acute bony abnormality. Review of the MIP images confirms the above findings. IMPRESSION: No evidence of pulmonary embolus. Cardiomegaly. No acute cardiopulmonary disease. Electronically Signed   By: Rolm Baptise M.D.   On: 08/30/2019 15:05   DG Chest Portable 1 View  Result Date: 08/30/2019 CLINICAL DATA:  Shortness of breath EXAM: PORTABLE CHEST 1 VIEW COMPARISON:  08/19/2019 FINDINGS: Cardiac shadow is mildly enlarged but stable. The lungs are well aerated. Mild vascular congestion is seen with slight increase in interstitial edema. No sizable effusion is noted. No focal infiltrate is seen. IMPRESSION: Changes of mild CHF Electronically Signed   By: Inez Catalina M.D.   On: 08/30/2019 11:32   DG Chest Port 1 View  Result Date: 08/19/2019 CLINICAL DATA:  Chest pain EXAM: PORTABLE CHEST 1 VIEW COMPARISON:  Chest radiograph dated 07/05/2019 FINDINGS: Heart is mildly enlarged. Both lungs are clear. The visualized skeletal structures are unremarkable. IMPRESSION: No active disease. Electronically Signed   By: Zerita Boers M.D.   On: 08/19/2019 18:23   CT Angio Chest/Abd/Pel for Dissection W and/or Wo Contrast  Result Date: 08/19/2019 CLINICAL DATA:  Short of breath, abdominal pain EXAM: CT ANGIOGRAPHY CHEST, ABDOMEN AND PELVIS TECHNIQUE: Multidetector CT imaging through the chest, abdomen and pelvis was performed using the standard protocol during bolus administration of intravenous contrast. Multiplanar reconstructed images and MIPs were obtained and reviewed to evaluate  the vascular anatomy. CONTRAST:  OMNIPAQUE IOHEXOL 350 MG/ML SOLN COMPARISON:  07/05/2019 FINDINGS: CTA CHEST FINDINGS Cardiovascular: This is a technically adequate evaluation of the thoracic aorta. No aneurysm or dissection. The heart is mildly enlarged but stable.  Continued enlargement of the pulmonary vasculature consistent with pulmonary arterial hypertension. While not optimized for opacification of the pulmonary vasculature, there are no filling defects or pulmonary emboli identified. Mediastinum/Nodes: No enlarged mediastinal, hilar, or axillary lymph nodes. Thyroid gland, trachea, and esophagus demonstrate no significant findings. Lungs/Pleura: Stable background emphysema, upper lobe predominant. No airspace disease, effusion, or pneumothorax. Central airways are patent. Musculoskeletal: No acute or destructive bony lesions. Reconstructed images demonstrate no additional findings. Review of the MIP images confirms the above findings. CTA ABDOMEN AND PELVIS FINDINGS VASCULAR Aorta: Normal caliber aorta without aneurysm, dissection, vasculitis or significant stenosis. Celiac: Patent without evidence of aneurysm, dissection, vasculitis or significant stenosis. SMA: Patent without evidence of aneurysm, dissection, vasculitis or significant stenosis. Renals: Both renal arteries are patent without evidence of aneurysm, dissection, vasculitis, fibromuscular dysplasia or significant stenosis. IMA: Patent without evidence of aneurysm, dissection, vasculitis or significant stenosis. Inflow: Patent without evidence of aneurysm, dissection, vasculitis or significant stenosis. Veins: No obvious venous abnormality within the limitations of this arterial phase study. Review of the MIP images confirms the above findings. NON-VASCULAR Hepatobiliary: No focal liver abnormality is seen. No gallstones, gallbladder wall thickening, or biliary dilatation. Pancreas: Unremarkable. No pancreatic ductal dilatation or surrounding inflammatory changes. Spleen: Normal in size without focal abnormality. Adrenals/Urinary Tract: Adrenal glands are unremarkable. Kidneys are normal, without renal calculi, focal lesion, or hydronephrosis. Bladder is unremarkable. Stomach/Bowel: No bowel obstruction or  ileus. Normal appendix right lower quadrant. Scattered diverticulosis of the descending and sigmoid colon without diverticulitis. No bowel wall thickening or inflammatory change. Lymphatic: No pathologic adenopathy within the abdomen or pelvis. Reproductive: Prostate is unremarkable. Other: No abdominal wall hernia or abnormality. No abdominopelvic ascites. Musculoskeletal: There is severe osteoarthritis of the right hip, with complete loss of joint space, remodeling of the femoral head and acetabular roof, subchondral cyst formation, eburnation, and marked osteophyte formation. There are no acute displaced fractures. Reconstructed images demonstrate no additional findings. Review of the MIP images confirms the above findings. IMPRESSION: 1. No evidence of aortic aneurysm or dissection. 2. No evidence of pulmonary embolus. Stable pulmonary arterial enlargement consistent with pulmonary arterial hypertension. 3.  Emphysema (ICD10-J43.9). 4. Diverticulosis without diverticulitis. 5. No acute intrathoracic, intra-abdominal, or intrapelvic process. Electronically Signed   By: Sharlet Salina M.D.   On: 08/19/2019 18:02      Discharge Exam: Vitals:   08/30/19 1730 08/30/19 1830  BP: (!) 106/94 112/84  Pulse: 83 88  Resp: 20 (!) 22  Temp:    SpO2: 96% 96%   Vitals:   08/30/19 1636 08/30/19 1700 08/30/19 1730 08/30/19 1830  BP:  106/90 (!) 106/94 112/84  Pulse: 83 85 83 88  Resp: 18 17 20  (!) 22  Temp:      TempSrc:      SpO2: 97% 97% 96% 96%  Weight:      Height:        Physical examination was not able to be performed since patient left hospital AMA when I was not in hospital.    The results of significant diagnostics from this hospitalization (including imaging, microbiology, ancillary and laboratory) are listed below for reference.     Microbiology: Recent Results (from the past 240 hour(s))  Respiratory Panel by RT PCR (Flu A&B,  Covid) - Nasopharyngeal Swab     Status: None    Collection Time: 08/30/19 12:51 PM   Specimen: Nasopharyngeal Swab  Result Value Ref Range Status   SARS Coronavirus 2 by RT PCR NEGATIVE NEGATIVE Final    Comment: (NOTE) SARS-CoV-2 target nucleic acids are NOT DETECTED. The SARS-CoV-2 RNA is generally detectable in upper respiratoy specimens during the acute phase of infection. The lowest concentration of SARS-CoV-2 viral copies this assay can detect is 131 copies/mL. A negative result does not preclude SARS-Cov-2 infection and should not be used as the sole basis for treatment or other patient management decisions. A negative result may occur with  improper specimen collection/handling, submission of specimen other than nasopharyngeal swab, presence of viral mutation(s) within the areas targeted by this assay, and inadequate number of viral copies (<131 copies/mL). A negative result must be combined with clinical observations, patient history, and epidemiological information. The expected result is Negative. Fact Sheet for Patients:  https://www.moore.com/ Fact Sheet for Healthcare Providers:  https://www.young.biz/ This test is not yet ap proved or cleared by the [place] FDA and  has been authorized for detection and/or diagnosis of SARS-CoV-2 by FDA under an Emergency Use Authorization (EUA). This EUA will remain  in effect (meaning this test can be used) for the duration of the COVID-19 declaration under Section 564(b)(1) of the Act, 21 U.S.C. section 360bbb-3(b)(1), unless the authorization is terminated or revoked sooner.    Influenza A by PCR NEGATIVE NEGATIVE Final   Influenza B by PCR NEGATIVE NEGATIVE Final    Comment: (NOTE) The Xpert Xpress SARS-CoV-2/FLU/RSV assay is intended as an aid in  the diagnosis of influenza from Nasopharyngeal swab specimens and  should not be used as a sole basis for treatment. Nasal washings and  aspirates are unacceptable for Xpert Xpress  SARS-CoV-2/FLU/RSV  testing. Fact Sheet for Patients: https://www.moore.com/ Fact Sheet for Healthcare Providers: https://www.young.biz/ This test is not yet approved or cleared by the [place] FDA and  has been authorized for detection and/or diagnosis of SARS-CoV-2 by  FDA under an Emergency Use Authorization (EUA). This EUA will remain  in effect (meaning this test can be used) for the duration of the  Covid-19 declaration under Section 564(b)(1) of the Act, 21  U.S.C. section 360bbb-3(b)(1), unless the authorization is  terminated or revoked. Performed at [facility], [address] [place], [place] [address]      Labs: BNP (last 3 results) Recent Labs    07/05/19 0944 08/30/19 1035  BNP 2,514.0* 3,984.0*   Basic Metabolic Panel: Recent Labs  Lab 08/30/19 1035  NA 138  K 4.1  CL 103  CO2 26  GLUCOSE 109*  BUN 28*  CREATININE 1.33*  CALCIUM 9.2   Liver Function Tests: Recent Labs  Lab 08/30/19 1035  AST 50*  ALT 41  ALKPHOS 59  BILITOT 0.8  PROT 7.1  ALBUMIN 4.1   No results for input(s): LIPASE, AMYLASE in the last 168 hours. No results for input(s): AMMONIA in the last 168 hours. CBC: Recent Labs  Lab 08/30/19 1035  WBC 5.4  HGB 15.1  HCT 45.2  MCV 95.2  PLT 309   Cardiac Enzymes: No results for input(s): CKTOTAL, CKMB, CKMBINDEX, TROPONINI in the last 168 hours. BNP: Invalid input(s): POCBNP CBG: Recent Labs  Lab 08/30/19 1903  GLUCAP 149*   D-Dimer No results for input(s): DDIMER in the last 72 hours. Hgb A1c No results for input(s): HGBA1C in the last 72 hours. Lipid  Profile No results for input(s): CHOL, HDL, LDLCALC, TRIG, CHOLHDL, LDLDIRECT in the last 72 hours. Thyroid function studies No results for input(s): TSH, T4TOTAL, T3FREE, THYROIDAB in the last 72 hours.  Invalid input(s): FREET3 Anemia work up No results for input(s): VITAMINB12, FOLATE, FERRITIN, TIBC, IRON,  RETICCTPCT in the last 72 hours. Urinalysis No results found for: COLORURINE, APPEARANCEUR, LABSPEC, PHURINE, GLUCOSEU, HGBUR, BILIRUBINUR, KETONESUR, PROTEINUR, UROBILINOGEN, NITRITE, LEUKOCYTESUR Sepsis Labs Invalid input(s): PROCALCITONIN,  WBC,  LACTICIDVEN Microbiology Recent Results (from the past 240 hour(s))  Respiratory Panel by RT PCR (Flu A&B, Covid) - Nasopharyngeal Swab     Status: None   Collection Time: 08/30/19 12:51 PM   Specimen: Nasopharyngeal Swab  Result Value Ref Range Status   SARS Coronavirus 2 by RT PCR NEGATIVE NEGATIVE Final    Comment: (NOTE) SARS-CoV-2 target nucleic acids are NOT DETECTED. The SARS-CoV-2 RNA is generally detectable in upper respiratoy specimens during the acute phase of infection. The lowest concentration of SARS-CoV-2 viral copies this assay can detect is 131 copies/mL. A negative result does not preclude SARS-Cov-2 infection and should not be used as the sole basis for treatment or other patient management decisions. A negative result may occur with  improper specimen collection/handling, submission of specimen other than nasopharyngeal swab, presence of viral mutation(s) within the areas targeted by this assay, and inadequate number of viral copies (<131 copies/mL). A negative result must be combined with clinical observations, patient history, and epidemiological information. The expected result is Negative. Fact Sheet for Patients:  https://www.moore.com/https://www.fda.gov/media/142436/download Fact Sheet for Healthcare Providers:  https://www.young.biz/https://www.fda.gov/media/142435/download This test is not yet ap proved or cleared by the [place] FDA and  has been authorized for detection and/or diagnosis of SARS-CoV-2 by FDA under an Emergency Use Authorization (EUA). This EUA will remain  in effect (meaning this test can be used) for the duration of the COVID-19 declaration under Section 564(b)(1) of the Act, 21 U.S.C. section 360bbb-3(b)(1), unless the  authorization is terminated or revoked sooner.    Influenza A by PCR NEGATIVE NEGATIVE Final   Influenza B by PCR NEGATIVE NEGATIVE Final    Comment: (NOTE) The Xpert Xpress SARS-CoV-2/FLU/RSV assay is intended as an aid in  the diagnosis of influenza from Nasopharyngeal swab specimens and  should not be used as a sole basis for treatment. Nasal washings and  aspirates are unacceptable for Xpert Xpress SARS-CoV-2/FLU/RSV  testing. Fact Sheet for Patients: https://www.moore.com/https://www.fda.gov/media/142436/download Fact Sheet for Healthcare Providers: https://www.young.biz/https://www.fda.gov/media/142435/download This test is not yet approved or cleared by the [place] FDA and  has been authorized for detection and/or diagnosis of SARS-CoV-2 by  FDA under an Emergency Use Authorization (EUA). This EUA will remain  in effect (meaning this test can be used) for the duration of the  Covid-19 declaration under Section 564(b)(1) of the Act, 21  U.S.C. section 360bbb-3(b)(1), unless the authorization is  terminated or revoked. Performed at [facility], [address] [place], [place] [address]     Time coordinating discharge:  20 minutes.   SIGNED:  Lorretta HarpXilin Ernie Kasler, MD [facility] 08/31/2019, 4:28 PM   If 7PM-7AM, please contact night-coverage [email or web address]

## 2019-09-10 ENCOUNTER — Other Ambulatory Visit: Payer: Self-pay

## 2019-09-10 ENCOUNTER — Emergency Department: Payer: Medicare Other

## 2019-09-10 ENCOUNTER — Emergency Department
Admission: EM | Admit: 2019-09-10 | Discharge: 2019-09-10 | Disposition: A | Discharge status: Home / Self Care | Payer: Medicare Other | Attending: Emergency Medicine | Admitting: Emergency Medicine

## 2019-09-10 DIAGNOSIS — R079 Chest pain, unspecified: Secondary | ICD-10-CM | POA: Diagnosis not present

## 2019-09-10 DIAGNOSIS — Z5321 Procedure and treatment not carried out due to patient leaving prior to being seen by health care provider: Secondary | ICD-10-CM | POA: Diagnosis not present

## 2019-09-10 LAB — CBC WITH DIFFERENTIAL/PLATELET
Abs Immature Granulocytes: 0.03 10*3/uL (ref 0.00–0.07)
Basophils Absolute: 0 10*3/uL (ref 0.0–0.1)
Basophils Relative: 0 %
Eosinophils Absolute: 0.3 10*3/uL (ref 0.0–0.5)
Eosinophils Relative: 5 %
HCT: 43.9 % (ref 39.0–52.0)
Hemoglobin: 14.7 g/dL (ref 13.0–17.0)
Immature Granulocytes: 1 %
Lymphocytes Relative: 26 %
Lymphs Abs: 1.5 10*3/uL (ref 0.7–4.0)
MCH: 32 pg (ref 26.0–34.0)
MCHC: 33.5 g/dL (ref 30.0–36.0)
MCV: 95.4 fL (ref 80.0–100.0)
Monocytes Absolute: 0.4 10*3/uL (ref 0.1–1.0)
Monocytes Relative: 6 %
Neutro Abs: 3.7 10*3/uL (ref 1.7–7.7)
Neutrophils Relative %: 62 %
Platelets: 213 10*3/uL (ref 150–400)
RBC: 4.6 MIL/uL (ref 4.22–5.81)
RDW: 14.2 % (ref 11.5–15.5)
WBC: 5.9 10*3/uL (ref 4.0–10.5)
nRBC: 0 % (ref 0.0–0.2)

## 2019-09-10 LAB — COMPREHENSIVE METABOLIC PANEL
ALT: 28 U/L (ref 0–44)
AST: 25 U/L (ref 15–41)
Albumin: 3.8 g/dL (ref 3.5–5.0)
Alkaline Phosphatase: 48 U/L (ref 38–126)
Anion gap: 7 (ref 5–15)
BUN: 38 mg/dL — ABNORMAL HIGH (ref 8–23)
CO2: 31 mmol/L (ref 22–32)
Calcium: 8.6 mg/dL — ABNORMAL LOW (ref 8.9–10.3)
Chloride: 100 mmol/L (ref 98–111)
Creatinine, Ser: 1.24 mg/dL (ref 0.61–1.24)
GFR calc Af Amer: >60 mL/min (ref >60)
GFR calc non Af Amer: >60 mL/min (ref >60)
Glucose, Bld: 94 mg/dL (ref 70–99)
Potassium: 4 mmol/L (ref 3.5–5.1)
Sodium: 138 mmol/L (ref 135–145)
Total Bilirubin: 1.1 mg/dL (ref 0.3–1.2)
Total Protein: 6.5 g/dL (ref 6.5–8.1)

## 2019-09-10 LAB — TROPONIN I (HIGH SENSITIVITY): Troponin I (High Sensitivity): 51 ng/L — ABNORMAL HIGH (ref <18)

## 2019-09-10 NOTE — ED Notes (Signed)
Pt ambulatory to stat desk without difficulty or distress noted; pt reports he is leaving now and doesn't want to wait any longer; explained to pt importance of staying and being evaluated further and having repeat labwork; pt st he is not going to wait despite being told several times it is strongly and adamantly recommended that he stay; pt st he is leaving and going to another hospital

## 2019-09-10 NOTE — ED Triage Notes (Signed)
Pt arrives to ED via POV from home with c/o CP and "a little bit" SHOB. Pt reports CP is left-sided with radiaiton into the right chest; no radiation into the neck, jaw, back or arms. Pt denies N/V; no diaphoresis. Pt reports h/x of HTN, CHF, and COPD. Pt is A&), in NAD; RR even, regular, and unlabored.

## 2019-09-11 ENCOUNTER — Other Ambulatory Visit: Payer: Self-pay

## 2019-09-11 ENCOUNTER — Emergency Department
Admission: EM | Admit: 2019-09-11 | Discharge: 2019-09-11 | Discharge status: Against Medical Advice | Payer: Medicare Other | Attending: Emergency Medicine | Admitting: Emergency Medicine

## 2019-09-11 ENCOUNTER — Telehealth: Payer: Self-pay | Admitting: Emergency Medicine

## 2019-09-11 ENCOUNTER — Encounter: Payer: Self-pay | Admitting: Emergency Medicine

## 2019-09-11 DIAGNOSIS — Z5321 Procedure and treatment not carried out due to patient leaving prior to being seen by health care provider: Secondary | ICD-10-CM | POA: Diagnosis not present

## 2019-09-11 DIAGNOSIS — R079 Chest pain, unspecified: Secondary | ICD-10-CM | POA: Insufficient documentation

## 2019-09-11 LAB — CBC
HCT: 42 % (ref 39.0–52.0)
Hemoglobin: 14.4 g/dL (ref 13.0–17.0)
MCH: 31.9 pg (ref 26.0–34.0)
MCHC: 34.3 g/dL (ref 30.0–36.0)
MCV: 92.9 fL (ref 80.0–100.0)
Platelets: 189 10*3/uL (ref 150–400)
RBC: 4.52 MIL/uL (ref 4.22–5.81)
RDW: 14.1 % (ref 11.5–15.5)
WBC: 6.3 10*3/uL (ref 4.0–10.5)
nRBC: 0 % (ref 0.0–0.2)

## 2019-09-11 LAB — BASIC METABOLIC PANEL
Anion gap: 11 (ref 5–15)
BUN: 23 mg/dL (ref 8–23)
CO2: 27 mmol/L (ref 22–32)
Calcium: 8.9 mg/dL (ref 8.9–10.3)
Chloride: 101 mmol/L (ref 98–111)
Creatinine, Ser: 1.08 mg/dL (ref 0.61–1.24)
GFR calc Af Amer: >60 mL/min (ref >60)
GFR calc non Af Amer: >60 mL/min (ref >60)
Glucose, Bld: 170 mg/dL — ABNORMAL HIGH (ref 70–99)
Potassium: 3.9 mmol/L (ref 3.5–5.1)
Sodium: 139 mmol/L (ref 135–145)

## 2019-09-11 LAB — TROPONIN I (HIGH SENSITIVITY): Troponin I (High Sensitivity): 35 ng/L — ABNORMAL HIGH (ref <18)

## 2019-09-11 MED ORDER — SODIUM CHLORIDE 0.9% FLUSH: 3.0000 mL | Freq: Once | INTRAVENOUS | Status: DC

## 2019-09-11 NOTE — ED Triage Notes (Signed)
Patient reports chest pain since yesterday. Reports he was seen here but left before being seen but was called back due to elevated troponin. Patient reports no relief in pain.   Patient also complaining of redness, swelling and burning in eyes. Thinks it is from pollen.

## 2019-09-11 NOTE — Telephone Encounter (Signed)
Called patient due to lwot to inquire about condition and follow up plans. Left message.   

## 2019-09-11 NOTE — Telephone Encounter (Signed)
Patient called me back. Says he feels weak, and he slept all day.  I told him that he needs to be seen by a doctor to see If his pain is from his heart.  He says  He doesn't have pcp.  I told him he can always return here.  He says he might come back.

## 2019-10-30 ENCOUNTER — Emergency Department
Admission: EM | Admit: 2019-10-30 | Discharge: 2019-10-30 | Disposition: A | Discharge status: Home / Self Care | Payer: Medicare Other | Attending: Student in an Organized Health Care Education/Training Program | Admitting: Student in an Organized Health Care Education/Training Program

## 2019-10-30 ENCOUNTER — Emergency Department: Payer: Medicare Other

## 2019-10-30 ENCOUNTER — Other Ambulatory Visit: Payer: Self-pay

## 2019-10-30 DIAGNOSIS — Z79899 Other long term (current) drug therapy: Secondary | ICD-10-CM | POA: Insufficient documentation

## 2019-10-30 DIAGNOSIS — E119 Type 2 diabetes mellitus without complications: Secondary | ICD-10-CM | POA: Insufficient documentation

## 2019-10-30 DIAGNOSIS — J449 Chronic obstructive pulmonary disease, unspecified: Secondary | ICD-10-CM | POA: Diagnosis not present

## 2019-10-30 DIAGNOSIS — R0789 Other chest pain: Secondary | ICD-10-CM

## 2019-10-30 DIAGNOSIS — I509 Heart failure, unspecified: Secondary | ICD-10-CM | POA: Diagnosis not present

## 2019-10-30 DIAGNOSIS — J40 Bronchitis, not specified as acute or chronic: Secondary | ICD-10-CM | POA: Diagnosis not present

## 2019-10-30 LAB — BASIC METABOLIC PANEL
Anion gap: 11 (ref 5–15)
BUN: 22 mg/dL (ref 8–23)
CO2: 30 mmol/L (ref 22–32)
Calcium: 8.9 mg/dL (ref 8.9–10.3)
Chloride: 99 mmol/L (ref 98–111)
Creatinine, Ser: 1.3 mg/dL — ABNORMAL HIGH (ref 0.61–1.24)
GFR calc Af Amer: >60 mL/min (ref >60)
GFR calc non Af Amer: 58 mL/min — ABNORMAL LOW (ref >60)
Glucose, Bld: 101 mg/dL — ABNORMAL HIGH (ref 70–99)
Potassium: 4.1 mmol/L (ref 3.5–5.1)
Sodium: 140 mmol/L (ref 135–145)

## 2019-10-30 LAB — CBC
HCT: 43.9 % (ref 39.0–52.0)
Hemoglobin: 14.9 g/dL (ref 13.0–17.0)
MCH: 31.8 pg (ref 26.0–34.0)
MCHC: 33.9 g/dL (ref 30.0–36.0)
MCV: 93.8 fL (ref 80.0–100.0)
Platelets: 297 10*3/uL (ref 150–400)
RBC: 4.68 MIL/uL (ref 4.22–5.81)
RDW: 15 % (ref 11.5–15.5)
WBC: 5.4 10*3/uL (ref 4.0–10.5)
nRBC: 0 % (ref 0.0–0.2)

## 2019-10-30 LAB — TROPONIN I (HIGH SENSITIVITY)
Troponin I (High Sensitivity): 20 ng/L — ABNORMAL HIGH (ref <18)
Troponin I (High Sensitivity): 18 ng/L — ABNORMAL HIGH (ref <18)

## 2019-10-30 MED ORDER — PREDNISONE 20 MG PO TABS
60.0000 mg | ORAL_TABLET | Freq: Once | ORAL | Status: AC
  Administered 2019-10-30: 60 mg via ORAL
  Filled 2019-10-30: qty 3

## 2019-10-30 MED ORDER — IPRATROPIUM-ALBUTEROL 0.5-2.5 (3) MG/3ML IN SOLN
3.0000 mL | Freq: Once | RESPIRATORY_TRACT | Status: AC
  Administered 2019-10-30: 3 mL via RESPIRATORY_TRACT
  Filled 2019-10-30: qty 3

## 2019-10-30 MED ORDER — ALBUTEROL SULFATE HFA 108 (90 BASE) MCG/ACT IN AERS: 2.0000 | INHALATION_SPRAY | Freq: Four times a day (QID) | RESPIRATORY_TRACT | 1 refills | Status: AC | PRN

## 2019-10-30 MED ORDER — PREDNISONE 20 MG PO TABS: 40.0000 mg | ORAL_TABLET | Freq: Every day | ORAL | 0 refills | Status: AC

## 2019-10-30 NOTE — ED Triage Notes (Signed)
First nurse note- pt here for CP. Declined wheelchair. Pulled for EKG.

## 2019-10-30 NOTE — ED Notes (Signed)
Patient transferred to room 5. Patient refused wheelchair.

## 2019-10-30 NOTE — ED Provider Notes (Signed)
[facility] Emergency Department Provider Note    First MD Initiated Contact with Patient 10/30/19 1255     (approximate)  I have reviewed the triage vital signs and the nursing notes.   HISTORY  Chief Complaint Chest Pain    HPI Scott Mcknight. is a 64 y.o. male [place] past medical history presents to the ER for evaluation of chest tightness chest discomfort with some shortness of breath.  Patient states he is "smoked a whole ton "last night.  Reports extensive history of smoking.  Also has a history of COPD CHF.  Was having this chest tightness was worried this could be cardiac in nature so came to the ER.  Is currently chest pain-free but still having some wheezing and shortness of breath.  Denies any lower extremity swelling.  No recent fevers.    Past Medical History:  Diagnosis Date  . Asthma   . CHF (congestive heart failure) ([place])   . COPD (chronic obstructive pulmonary disease) ([place])   . Coronary artery disease   . Diabetes mellitus without complication ([place])    No family history on file. No past surgical history on file. Patient Active Problem List   Diagnosis Date Noted  . Acute respiratory failure with hypoxia ([place])   . Type II diabetes mellitus with renal manifestations ([place]) 08/30/2019  . HLD (hyperlipidemia) 08/30/2019  . Coronary artery disease   . COPD exacerbation ([place])   . Acute on chronic systolic CHF (congestive heart failure) ([place])   . DVT (deep venous thrombosis) ([place])   . Tobacco abuse   . Hypothermia   . Elevated troponin   . Acute on chronic respiratory failure with hypoxia ([place])   . Chest pain       Prior to Admission medications   Medication Sig Start Date End Date Taking? Authorizing Provider  albuterol (VENTOLIN HFA) 108 (90 Base) MCG/ACT inhaler Inhale 2 puffs into the lungs every 6 (six) hours as needed for wheezing or shortness of breath. 10/30/19   Merlyn Lot, MD  atorvastatin (LIPITOR) 10 MG tablet  Take 10 mg by mouth daily.    [provider]  ELIQUIS 5 MG TABS tablet Take 5 mg by mouth 2 (two) times daily. 08/19/19   [provider]  FARXIGA 10 MG TABS tablet Take 10 mg by mouth daily.    [provider]  furosemide (LASIX) 20 MG tablet Take 20 mg by mouth daily.    [provider]  ipratropium-albuterol (DUONEB) 0.5-2.5 (3) MG/3ML SOLN Take 3 mLs by nebulization 4 (four) times daily as needed for wheezing or shortness of breath.    [provider]  metoprolol succinate (TOPROL-XL) 25 MG 24 hr tablet Take 25 mg by mouth daily.    [provider]  predniSONE (DELTASONE) 20 MG tablet Take 2 tablets (40 mg total) by mouth daily for 5 days. 10/30/19 11/04/19  Merlyn Lot, MD  sacubitril-valsartan (ENTRESTO) 24-26 MG Take 1 tablet by mouth 2 (two) times daily.    [provider]  spironolactone (ALDACTONE) 25 MG tablet Take 12.5 mg by mouth daily.    [provider]  TRELEGY ELLIPTA 100-62.5-25 MCG/INH AEPB Take 1 puff by mouth daily.    [provider]    Allergies Patient has no known allergies.    Social History Social History   Tobacco Use  . Smoking status: Current Every Day Smoker    Packs/day: 0.50    Types: Cigarettes  . Smokeless tobacco:  Never Used  Substance Use Topics  . Alcohol use: Yes    Comment: occasionally  . Drug use: Never    Review of Systems Patient denies headaches, rhinorrhea, blurry vision, numbness, shortness of breath, chest pain, edema, cough, abdominal pain, nausea, vomiting, diarrhea, dysuria, fevers, rashes or hallucinations unless otherwise stated above in HPI. ____________________________________________   PHYSICAL EXAM:  VITAL SIGNS: Vitals:   10/30/19 1430 10/30/19 1500  BP: 111/65 115/87  Pulse: (!) 46 84  Resp: (!) 33 19  Temp:    SpO2: 97% 94%    Constitutional: Alert and oriented.  Eyes: Conjunctivae are normal.  Head: Atraumatic. Nose: No  congestion/rhinnorhea. Mouth/Throat: Mucous membranes are moist.   Neck: No stridor. Painless ROM.  Cardiovascular: Normal rate, regular rhythm. Grossly normal heart sounds.  Good peripheral circulation. Respiratory: Normal respiratory effort.  No retractions. Lungs with coarse wheezing throughout all lung fields Gastrointestinal: Soft and nontender. No distention. No abdominal bruits. No CVA tenderness. Genitourinary:  Musculoskeletal: No lower extremity tenderness nor edema.  No joint effusions. Neurologic:  Normal speech and language. No gross focal neurologic deficits are appreciated. No facial droop Skin:  Skin is warm, dry and intact. No rash noted. Psychiatric: Mood and affect are normal. Speech and behavior are normal.  ____________________________________________   LABS (all labs ordered are listed, but only abnormal results are displayed)  Results for orders placed or performed during the hospital encounter of 10/30/19 (from the past 24 hour(s))  Basic metabolic panel     Status: Abnormal   Collection Time: 10/30/19 12:35 PM  Result Value Ref Range   Sodium 140 135 - 145 mmol/L   Potassium 4.1 3.5 - 5.1 mmol/L   Chloride 99 98 - 111 mmol/L   CO2 30 22 - 32 mmol/L   Glucose, Bld 101 (H) 70 - 99 mg/dL   BUN 22 8 - 23 mg/dL   Creatinine, Ser 9.32 (H) 0.61 - 1.24 mg/dL   Calcium 8.9 8.9 - 67.1 mg/dL   GFR calc non Af Amer 58 (L) >60 mL/min   GFR calc Af Amer >60 >60 mL/min   Anion gap 11 5 - 15  CBC     Status: None   Collection Time: 10/30/19 12:35 PM  Result Value Ref Range   WBC 5.4 4.0 - 10.5 K/uL   RBC 4.68 4.22 - 5.81 MIL/uL   Hemoglobin 14.9 13.0 - 17.0 g/dL   HCT 24.5 80.9 - 98.3 %   MCV 93.8 80.0 - 100.0 fL   MCH 31.8 26.0 - 34.0 pg   MCHC 33.9 30.0 - 36.0 g/dL   RDW 38.2 50.5 - 39.7 %   Platelets 297 150 - 400 K/uL   nRBC 0.0 0.0 - 0.2 %  Troponin I (High Sensitivity)     Status: Abnormal   Collection Time: 10/30/19 12:35 PM  Result Value Ref Range    Troponin I (High Sensitivity) 20 (H) <18 ng/L  Troponin I (High Sensitivity)     Status: Abnormal   Collection Time: 10/30/19  2:25 PM  Result Value Ref Range   Troponin I (High Sensitivity) 18 (H) <18 ng/L   ____________________________________________  EKG My review and personal interpretation at Time: 12:19   Indication: sob  Rate: 100  Rhythm: sinus Axis: normal Other: normal intervals,  ____________________________________________  RADIOLOGY  I personally reviewed all radiographic images ordered to evaluate for the above acute complaints and reviewed radiology reports and findings.  These findings were personally discussed with the patient.  Please see medical record for radiology report.  ____________________________________________   PROCEDURES  Procedure(s) performed:  Procedures    Critical Care performed: no ____________________________________________   INITIAL IMPRESSION / ASSESSMENT AND PLAN / ED COURSE  Pertinent labs & imaging results that were available during my care of the patient were reviewed by me and considered in my medical decision making (see chart for details).   DDX: COPD, bronchitis, pneumonia, ACS, dissection, PE, enteritis, pleurisy  Scott Mcknight. is a 64 y.o. who presents to the ED with symptoms as described above.  Patient very well-appearing no acute distress.  EKG is consistent with previous.  Troponin consistent with previous.  Have a low suspicion for ACS.  Has diffuse wheezing on exam and endorses smoking significant amount last night suspect bronchitis.  Will treat with steroid and nebs and observe.  This does not seem consistent with dissection or PE.  Is abdominal exam is soft and benign.  Clinical Course as of Oct 30 1551  Tue Oct 30, 2019  1552 Patient reassessed and feels much improved.  Able to ambulate with steady gait with no hypoxia.  Not having active chest pain.  Presentation consistent with bronchitis.  Will send  prednisone to his pharmacy and refill for his bronchodilators.  Discussed signs symptoms which he should return to the ER.   [PR]    Clinical Course User Index [PR] Willy Eddy, MD    The patient was evaluated in Emergency Department today for the symptoms described in the history of present illness. He/she was evaluated in the context of the global COVID-19 pandemic, which necessitated consideration that the patient might be at risk for infection with the SARS-CoV-2 virus that causes COVID-19. Institutional protocols and algorithms that pertain to the evaluation of patients at risk for COVID-19 are in a state of rapid change based on information released by regulatory bodies including the CDC and federal and state organizations. These policies and algorithms were followed during the patient's care in the ED.  As part of my medical decision making, I reviewed the following data within the electronic MEDICAL RECORD NUMBER Nursing notes reviewed and incorporated, Labs reviewed, notes from prior ED visits and [place] Controlled Substance Database   ____________________________________________   FINAL CLINICAL IMPRESSION(S) / ED DIAGNOSES  Final diagnoses:  Atypical chest pain  Bronchitis      NEW MEDICATIONS STARTED DURING THIS VISIT:  New Prescriptions   PREDNISONE (DELTASONE) 20 MG TABLET    Take 2 tablets (40 mg total) by mouth daily for 5 days.     Note:  This document was prepared using Dragon voice recognition software and may include unintentional dictation errors.    Willy Eddy, MD 10/30/19 [phone]

## 2019-10-30 NOTE — ED Triage Notes (Signed)
Patient to ER for c/o chest pain with shortness of breath. Reports history of the same, but did not see doctor then. Patient also reports "my stomach is swollen up too".

## 2019-10-30 NOTE — ED Notes (Signed)
Resumed care from [facility].  Pt alert.  Sinus on monitor.  Pt reports he wants to talk with md, it's personal per patient.  Iv in place.  Skin warm and dry.

## 2019-11-11 ENCOUNTER — Other Ambulatory Visit: Payer: Self-pay

## 2019-11-11 ENCOUNTER — Emergency Department: Payer: Medicare Other

## 2019-11-11 ENCOUNTER — Emergency Department
Admission: EM | Admit: 2019-11-11 | Discharge: 2019-11-11 | Disposition: A | Discharge status: Home / Self Care | Payer: Medicare Other | Attending: Emergency Medicine | Admitting: Emergency Medicine

## 2019-11-11 DIAGNOSIS — Z79899 Other long term (current) drug therapy: Secondary | ICD-10-CM | POA: Insufficient documentation

## 2019-11-11 DIAGNOSIS — Z7901 Long term (current) use of anticoagulants: Secondary | ICD-10-CM | POA: Insufficient documentation

## 2019-11-11 DIAGNOSIS — E119 Type 2 diabetes mellitus without complications: Secondary | ICD-10-CM | POA: Diagnosis not present

## 2019-11-11 DIAGNOSIS — I5023 Acute on chronic systolic (congestive) heart failure: Secondary | ICD-10-CM | POA: Diagnosis not present

## 2019-11-11 DIAGNOSIS — F17210 Nicotine dependence, cigarettes, uncomplicated: Secondary | ICD-10-CM | POA: Diagnosis not present

## 2019-11-11 DIAGNOSIS — J449 Chronic obstructive pulmonary disease, unspecified: Secondary | ICD-10-CM | POA: Diagnosis not present

## 2019-11-11 DIAGNOSIS — R079 Chest pain, unspecified: Secondary | ICD-10-CM

## 2019-11-11 DIAGNOSIS — Z86718 Personal history of other venous thrombosis and embolism: Secondary | ICD-10-CM | POA: Diagnosis not present

## 2019-11-11 DIAGNOSIS — M7918 Myalgia, other site: Secondary | ICD-10-CM | POA: Insufficient documentation

## 2019-11-11 DIAGNOSIS — R0789 Other chest pain: Secondary | ICD-10-CM | POA: Diagnosis not present

## 2019-11-11 DIAGNOSIS — I2510 Atherosclerotic heart disease of native coronary artery without angina pectoris: Secondary | ICD-10-CM | POA: Insufficient documentation

## 2019-11-11 LAB — BASIC METABOLIC PANEL
Anion gap: 12 (ref 5–15)
BUN: 19 mg/dL (ref 8–23)
CO2: 26 mmol/L (ref 22–32)
Calcium: 9.1 mg/dL (ref 8.9–10.3)
Chloride: 99 mmol/L (ref 98–111)
Creatinine, Ser: 1.41 mg/dL — ABNORMAL HIGH (ref 0.61–1.24)
GFR calc Af Amer: >60 mL/min (ref >60)
GFR calc non Af Amer: 53 mL/min — ABNORMAL LOW (ref >60)
Glucose, Bld: 122 mg/dL — ABNORMAL HIGH (ref 70–99)
Potassium: 4.4 mmol/L (ref 3.5–5.1)
Sodium: 137 mmol/L (ref 135–145)

## 2019-11-11 LAB — CBC
HCT: 43.6 % (ref 39.0–52.0)
Hemoglobin: 15 g/dL (ref 13.0–17.0)
MCH: 31.9 pg (ref 26.0–34.0)
MCHC: 34.4 g/dL (ref 30.0–36.0)
MCV: 92.8 fL (ref 80.0–100.0)
Platelets: 247 10*3/uL (ref 150–400)
RBC: 4.7 MIL/uL (ref 4.22–5.81)
RDW: 15.4 % (ref 11.5–15.5)
WBC: 4.9 10*3/uL (ref 4.0–10.5)
nRBC: 0 % (ref 0.0–0.2)

## 2019-11-11 LAB — TROPONIN I (HIGH SENSITIVITY)
Troponin I (High Sensitivity): 20 ng/L — ABNORMAL HIGH (ref <18)
Troponin I (High Sensitivity): 23 ng/L — ABNORMAL HIGH (ref <18)

## 2019-11-11 MED ORDER — MORPHINE SULFATE (PF) 4 MG/ML IV SOLN
4.0000 mg | Freq: Once | INTRAVENOUS | Status: AC
  Administered 2019-11-11: 4 mg via INTRAVENOUS
  Filled 2019-11-11: qty 1

## 2019-11-11 MED ORDER — OXYCODONE-ACETAMINOPHEN 5-325 MG PO TABS
1.0000 | ORAL_TABLET | Freq: Once | ORAL | Status: AC
  Administered 2019-11-11: 1 via ORAL
  Filled 2019-11-11: qty 1

## 2019-11-11 MED ORDER — IOHEXOL 350 MG/ML SOLN
75.0000 mL | Freq: Once | INTRAVENOUS | Status: AC | PRN
  Administered 2019-11-11: 75 mL via INTRAVENOUS

## 2019-11-11 MED ORDER — ONDANSETRON HCL 4 MG/2ML IJ SOLN
4.0000 mg | Freq: Once | INTRAMUSCULAR | Status: AC
  Administered 2019-11-11: 4 mg via INTRAVENOUS
  Filled 2019-11-11: qty 2

## 2019-11-11 MED ORDER — OXYCODONE-ACETAMINOPHEN 5-325 MG PO TABS: 1.0000 | ORAL_TABLET | Freq: Four times a day (QID) | ORAL | 0 refills | Status: AC | PRN

## 2019-11-11 MED ORDER — SODIUM CHLORIDE 0.9 % IV BOLUS
500.0000 mL | Freq: Once | INTRAVENOUS | Status: AC
  Administered 2019-11-11: 500 mL via INTRAVENOUS

## 2019-11-11 NOTE — ED Provider Notes (Signed)
[facility] Emergency Department Provider Note  ____________________________________________   First MD Initiated Contact with Patient 11/11/19 1606     (approximate)  I have reviewed the triage vital signs and the nursing notes.   HISTORY  Chief Complaint Chest Pain   HPI Scott Mcknight. is a 64 y.o. male with a history of CHF, COPD, asthma, CAD, and diabetes presents to the emergency department for treatment and evaluation of pain in his chest that radiates into his back and up into the left side of his neck.  Symptoms started yesterday evening upon awakening from a nap.  He states that he went to the pharmacy and purchased over-the-counter pain patches and Tylenol.  He had no relief with these medications.   Past Medical History:  Diagnosis Date   Asthma    CHF (congestive heart failure) ([facility])    COPD (chronic obstructive pulmonary disease) ([facility])    Coronary artery disease    Diabetes mellitus without complication ([place])     Patient Active Problem List   Diagnosis Date Noted   Acute respiratory failure with hypoxia ([place])    Type II diabetes mellitus with renal manifestations ([place]) 08/30/2019   HLD (hyperlipidemia) 08/30/2019   Coronary artery disease    COPD exacerbation ([facility])    Acute on chronic systolic CHF (congestive heart failure) ([facility])    DVT (deep venous thrombosis) ([facility])    Tobacco abuse    Hypothermia    Elevated troponin    Acute on chronic respiratory failure with hypoxia ([facility])    Chest pain     History reviewed. No pertinent surgical history.  Prior to Admission medications   Medication Sig Start Date End Date Taking? Authorizing Provider  albuterol (VENTOLIN HFA) 108 (90 Base) MCG/ACT inhaler Inhale 2 puffs into the lungs every 6 (six) hours as needed for wheezing or shortness of breath. 10/30/19   Merlyn Lot, MD  atorvastatin (LIPITOR) 10 MG tablet Take 10 mg by mouth daily.    [provider]    ELIQUIS 5 MG TABS tablet Take 5 mg by mouth 2 (two) times daily. 08/19/19   [provider]  FARXIGA 10 MG TABS tablet Take 10 mg by mouth daily.    [provider]  furosemide (LASIX) 20 MG tablet Take 20 mg by mouth daily.    [provider]  ipratropium-albuterol (DUONEB) 0.5-2.5 (3) MG/3ML SOLN Take 3 mLs by nebulization 4 (four) times daily as needed for wheezing or shortness of breath.    [provider]  metoprolol succinate (TOPROL-XL) 25 MG 24 hr tablet Take 25 mg by mouth daily.    [provider]  sacubitril-valsartan (ENTRESTO) 24-26 MG Take 1 tablet by mouth 2 (two) times daily.    [provider]  spironolactone (ALDACTONE) 25 MG tablet Take 12.5 mg by mouth daily.    [provider]  TRELEGY ELLIPTA 100-62.5-25 MCG/INH AEPB Take 1 puff by mouth daily.    [provider]    Allergies Patient has no known allergies.  History reviewed. No pertinent family history.  Social History Social History   Tobacco Use   Smoking status: Current Every Day Smoker    Packs/day: 0.50    Types: Cigarettes   Smokeless tobacco: Never Used  Substance Use Topics   Alcohol use: Yes    Comment: occasionally   Drug use: Never    Review of Systems  Constitutional: No fever/chills. Eyes: No visual changes. ENT: No sore throat. Cardiovascular:  Positive for chest pain.  Positive for pleuritic pain.  Negative for palpitations.  Negative for leg pain. Respiratory: Positive for shortness of breath. Gastrointestinal: Negative for abdominal pain.  No nausea, no vomiting.  No diarrhea.  No constipation. Genitourinary: Negative for dysuria. Musculoskeletal: Positive for back pain.  Skin: Negative for rash, lesion, wound. Neurological: Negative for headaches, focal weakness or numbness. ____________________________________________   PHYSICAL EXAM:  VITAL SIGNS: ED Triage Vitals  Enc Vitals Group     BP 11/11/19  1553 91/65     Pulse Rate 11/11/19 1553 96     Resp 11/11/19 1553 (!) 22     Temp 11/11/19 1553 98.5 F (36.9 C)     Temp Source 11/11/19 1553 Oral     SpO2 11/11/19 1553 96 %     Weight 11/11/19 1551 180 lb (81.6 kg)     Height 11/11/19 1551 5\' 11"  (1.803 m)     Head Circumference --      Peak Flow --      Pain Score 11/11/19 1551 8     Pain Loc --      Pain Edu? --      Excl. in GC? --     Constitutional: Alert and oriented. Well appearing and in no acute distress. Normal mental status. Eyes: Conjunctivae are normal. PERRL. Head: Atraumatic. Nose: No congestion/rhinnorhea. Mouth/Throat: Mucous membranes are moist.  Oropharynx non-erythematous. Tongue normal in size and color. Neck: No stridor. No carotid bruit appreciated on exam. Hematological/Lymphatic/Immunilogical: No cervical lymphadenopathy. Cardiovascular: Normal rate, regular rhythm. Grossly normal heart sounds.  Good peripheral circulation. Respiratory: Normal respiratory effort.  No retractions. Lungs CTAB. Gastrointestinal: Soft and nontender. No distention. No abdominal bruits. No CVA tenderness. Genitourinary: Exam deferred. Musculoskeletal: No lower extremity tenderness. No edema of extremities. Neurologic:  Normal speech and language. No gross focal neurologic deficits are appreciated. Skin:  Skin is warm, dry and intact. No rash noted. Psychiatric: Mood and affect are normal. Speech and behavior are normal.  ____________________________________________   LABS (all labs ordered are listed, but only abnormal results are displayed)  Labs Reviewed  BASIC METABOLIC PANEL - Abnormal; Notable for the following components:      Result Value   Glucose, Bld 122 (*)    Creatinine, Ser 1.41 (*)    GFR calc non Af Amer 53 (*)    All other components within normal limits  TROPONIN I (HIGH SENSITIVITY) - Abnormal; Notable for the following components:   Troponin I (High Sensitivity) 20 (*)    All other components  within normal limits  TROPONIN I (HIGH SENSITIVITY) - Abnormal; Notable for the following components:   Troponin I (High Sensitivity) 23 (*)    All other components within normal limits  CBC   ____________________________________________  EKG  ED ECG REPORT I, Irina Okelly, FNP-BC personally viewed and interpreted this ECG.   Date: 11/11/2019  EKG Time: 1546  Rate: 97  Rhythm: unchanged from previous tracings, Sinus rhythm with PVCs  Axis: Rightward  Intervals:none  ST&T Change: no ST elevation  ____________________________________________  RADIOLOGY  ED MD interpretation: Chest x-ray shows no cardiopulmonary disease.  CTA chest for PE shows no pulmonary embolism or acute pulmonary process. He does have enlarged pulmonary arteries that suggest pulmonary hypertension he also has a little bit of cardiomegaly.  I, 11/13/2019, personally viewed and evaluated these images (plain radiographs) as part of my medical decision making, as well as reviewing the written report by the radiologist.  Official radiology  report(s): DG Chest 2 View  Result Date: 11/11/2019 CLINICAL DATA:  Chest pain.  Shortness of breath. EXAM: CHEST - 2 VIEW COMPARISON:  October 30, 2019 FINDINGS: The heart size and mediastinal contours are within normal limits. Both lungs are clear. The visualized skeletal structures are unremarkable. IMPRESSION: No active cardiopulmonary disease. Electronically Signed   By: Gerome Sam III M.D   On: 11/11/2019 16:17   CT Angio Chest PE W and/or Wo Contrast  Result Date: 11/11/2019 CLINICAL DATA:  Chest pain and shortness of breath EXAM: CT ANGIOGRAPHY CHEST WITH CONTRAST TECHNIQUE: Multidetector CT imaging of the chest was performed using the standard protocol during bolus administration of intravenous contrast. Multiplanar CT image reconstructions and MIPs were obtained to evaluate the vascular anatomy. CONTRAST:  4mL OMNIPAQUE IOHEXOL 350 MG/ML SOLN COMPARISON:  Chest  radiograph performed the same day and chest CT dated 08/30/2019. FINDINGS: Cardiovascular: Satisfactory opacification of the pulmonary arteries to the segmental level. No evidence of pulmonary embolism. The right pulmonary artery measures up to 3.5 cm and the left pulmonary artery measures up to 3.1 cm, suggestive of pulmonary hypertension. The heart is enlarged. No pericardial effusion. Mediastinum/Nodes: No enlarged mediastinal, hilar, or axillary lymph nodes. Thyroid gland, trachea, and esophagus demonstrate no significant findings. Lungs/Pleura: Lungs are clear. No pleural effusion or pneumothorax. Upper Abdomen: No acute abnormality. Musculoskeletal: No chest wall abnormality. No acute or significant osseous findings. Review of the MIP images confirms the above findings. IMPRESSION: 1. No pulmonary embolism.  No acute pulmonary process. 2. Enlarged pulmonary arteries, suggestive of pulmonary hypertension. 3. Cardiomegaly. Electronically Signed   By: Romona Curls M.D.   On: 11/11/2019 17:56    ____________________________________________   PROCEDURES  Procedure(s) performed: None  Procedures  Critical Care performed: No  ____________________________________________   INITIAL IMPRESSION / ASSESSMENT AND PLAN / ED COURSE  As part of my medical decision making, I reviewed the following data within the electronic MEDICAL RECORD NUMBER Old EKG reviewed and Notes from prior ED visits  64 year old male presenting to the emergency department for treatment and evaluation of pain in his back and chest that is radiating into his left upper back and neck. Exam seems more musculoskeletal related than cardiac however the patient does have a significant past cardiac history. Chest pain work-up will get be completed. ____________________________________________  Differential diagnosis includes, but not limited to:  STEMI, NSTEMI, musculoskeletal pain, dissection, pulmonary embolus   FINAL CLINICAL  IMPRESSION(S) / ED DIAGNOSES  Initial troponin is 20. Morphine and Zofran given have decreased the pain from a 9 to a 1 or 2 according to the patient. Awaiting CTA results and second troponin.  No PE according to CTA.  Repeat troponin is 23.  Patient is still a 1 on pain scale.  He denies any shortness of breath.  Plan will be to send him home with prescription for Percocet for musculoskeletal type pain.  He was instructed to call and schedule a follow-up appointment with his cardiologist.  If he is unable to see cardiology and his chest pain returns or if he develops any new symptom of concern he is to return to the emergency department.  Final diagnoses:  Nonspecific chest pain  Musculoskeletal pain     ED Discharge Orders    None       Scott Mcknight. was evaluated in Emergency Department on 11/11/2019 for the symptoms described in the history of present illness. He was evaluated in the context of the global COVID-19 pandemic, which  necessitated consideration that the patient might be at risk for infection with the SARS-CoV-2 virus that causes COVID-19. Institutional protocols and algorithms that pertain to the evaluation of patients at risk for COVID-19 are in a state of rapid change based on information released by regulatory bodies including the CDC and federal and state organizations. These policies and algorithms were followed during the patient's care in the ED.   Note:  This document was prepared using Dragon voice recognition software and may include unintentional dictation errors.   Chinita Pester, FNP 11/11/19 [place]    Shaune Pollack, MD 11/18/19 1546

## 2019-11-11 NOTE — ED Triage Notes (Signed)
Pt arrives to ER c/o of back pain going into chest. Pt cursing in triage. C/o HA as well as SOB.

## 2019-11-11 NOTE — Discharge Instructions (Signed)
Please call and schedule follow-up appointment with your cardiologist.  Take the medication as prescribed.  For symptoms of change or worsen if you are unable to schedule an appointment, please return to the emergency department.

## 2019-11-14 ENCOUNTER — Emergency Department
Admission: EM | Admit: 2019-11-14 | Discharge: 2019-11-14 | Disposition: A | Discharge status: Home / Self Care | Payer: Medicare Other | Attending: Student in an Organized Health Care Education/Training Program | Admitting: Student in an Organized Health Care Education/Training Program

## 2019-11-14 ENCOUNTER — Other Ambulatory Visit: Payer: Self-pay

## 2019-11-14 ENCOUNTER — Emergency Department: Payer: Medicare Other

## 2019-11-14 DIAGNOSIS — Z86718 Personal history of other venous thrombosis and embolism: Secondary | ICD-10-CM | POA: Insufficient documentation

## 2019-11-14 DIAGNOSIS — Z79899 Other long term (current) drug therapy: Secondary | ICD-10-CM | POA: Diagnosis not present

## 2019-11-14 DIAGNOSIS — F17210 Nicotine dependence, cigarettes, uncomplicated: Secondary | ICD-10-CM | POA: Diagnosis not present

## 2019-11-14 DIAGNOSIS — I5022 Chronic systolic (congestive) heart failure: Secondary | ICD-10-CM | POA: Insufficient documentation

## 2019-11-14 DIAGNOSIS — Z7901 Long term (current) use of anticoagulants: Secondary | ICD-10-CM | POA: Diagnosis not present

## 2019-11-14 DIAGNOSIS — M546 Pain in thoracic spine: Secondary | ICD-10-CM | POA: Insufficient documentation

## 2019-11-14 DIAGNOSIS — J449 Chronic obstructive pulmonary disease, unspecified: Secondary | ICD-10-CM | POA: Insufficient documentation

## 2019-11-14 DIAGNOSIS — I2510 Atherosclerotic heart disease of native coronary artery without angina pectoris: Secondary | ICD-10-CM | POA: Diagnosis not present

## 2019-11-14 DIAGNOSIS — M25512 Pain in left shoulder: Secondary | ICD-10-CM | POA: Diagnosis not present

## 2019-11-14 LAB — BASIC METABOLIC PANEL
Anion gap: 9 (ref 5–15)
BUN: 13 mg/dL (ref 8–23)
CO2: 30 mmol/L (ref 22–32)
Calcium: 9.2 mg/dL (ref 8.9–10.3)
Chloride: 102 mmol/L (ref 98–111)
Creatinine, Ser: 1.16 mg/dL (ref 0.61–1.24)
GFR calc Af Amer: >60 mL/min (ref >60)
GFR calc non Af Amer: >60 mL/min (ref >60)
Glucose, Bld: 103 mg/dL — ABNORMAL HIGH (ref 70–99)
Potassium: 3.4 mmol/L — ABNORMAL LOW (ref 3.5–5.1)
Sodium: 141 mmol/L (ref 135–145)

## 2019-11-14 LAB — TROPONIN I (HIGH SENSITIVITY)
Troponin I (High Sensitivity): 22 ng/L — ABNORMAL HIGH (ref <18)
Troponin I (High Sensitivity): 22 ng/L — ABNORMAL HIGH (ref <18)

## 2019-11-14 LAB — CBC
HCT: 45.5 % (ref 39.0–52.0)
Hemoglobin: 15.4 g/dL (ref 13.0–17.0)
MCH: 31.7 pg (ref 26.0–34.0)
MCHC: 33.8 g/dL (ref 30.0–36.0)
MCV: 93.6 fL (ref 80.0–100.0)
Platelets: 214 10*3/uL (ref 150–400)
RBC: 4.86 MIL/uL (ref 4.22–5.81)
RDW: 16 % — ABNORMAL HIGH (ref 11.5–15.5)
WBC: 5.6 10*3/uL (ref 4.0–10.5)
nRBC: 0 % (ref 0.0–0.2)

## 2019-11-14 MED ORDER — IOHEXOL 350 MG/ML SOLN
100.0000 mL | Freq: Once | INTRAVENOUS | Status: AC | PRN
  Administered 2019-11-14: 100 mL via INTRAVENOUS

## 2019-11-14 MED ORDER — OXYCODONE-ACETAMINOPHEN 5-325 MG PO TABS: 1.0000 | ORAL_TABLET | Freq: Once | ORAL | Status: DC

## 2019-11-14 MED ORDER — LORAZEPAM 2 MG/ML IJ SOLN
1.0000 mg | Freq: Once | INTRAMUSCULAR | Status: AC
  Administered 2019-11-14: 1 mg via INTRAVENOUS
  Filled 2019-11-14: qty 1

## 2019-11-14 MED ORDER — LIDOCAINE HCL (PF) 1 % IJ SOLN
INTRAMUSCULAR | Status: AC
  Administered 2019-11-14: 5 mL via INTRADERMAL
  Filled 2019-11-14: qty 5

## 2019-11-14 MED ORDER — DIAZEPAM 5 MG PO TABS: 5.0000 mg | ORAL_TABLET | Freq: Three times a day (TID) | ORAL | 0 refills | Status: AC | PRN

## 2019-11-14 MED ORDER — OXYCODONE-ACETAMINOPHEN 5-325 MG PO TABS
1.0000 | ORAL_TABLET | Freq: Once | ORAL | Status: AC
  Administered 2019-11-14: 1 via ORAL
  Filled 2019-11-14: qty 1

## 2019-11-14 MED ORDER — LIDOCAINE HCL (PF) 1 % IJ SOLN: 5.0000 mL | Freq: Once | INTRAMUSCULAR | Status: AC

## 2019-11-14 NOTE — ED Notes (Signed)
Pt demanding a shot for pain.  Still yelling and cursing in lobby.  Have instructed patient needs to stop yelling and cursing, there are kids and others in waiting room.  Offered to get a tylenol for pt, on blood thinners.  Pt declined pill stating "I only want a shot".  Complaining about wait. Have explained wait multiple times and pt remains to yell.   "why don't you put all these people in beds".  Again explained reason for wait.

## 2019-11-14 NOTE — ED Notes (Signed)
Two unsuccessful IV attempts. Patient was anxious and had difficulty tolerating attempts. Will defer to another RN or IV team. Dr. Roxan Hockey aware.

## 2019-11-14 NOTE — ED Provider Notes (Signed)
[facility] Emergency Department Provider Note    First MD Initiated Contact with Patient 11/14/19 1712     (approximate)  I have reviewed the triage vital signs and the nursing notes.   HISTORY  Chief Complaint Shoulder Pain    HPIClemon Olanda Mcknight. is a 64 y.o. male with the below listed past medical history presents to the ER for evaluation of left shoulder pain has been ongoing for the past several days.  It is worse with motion and that is shooting through to his back and reports the pain is severe.  States he feels like  He slept wrong and pulled a muscles.  Has been taking Percocet without any improvement.  Patient reportedly belligerent with staff in the waiting room.  He is now very tearful stating the pain is excruciating.  Denies any fevers.  No history of gout or arthritis.  Denies any trauma.  Denies any chest pain at this time. Denies any SOB or abdominal pain.   Past Medical History:  Diagnosis Date  . Asthma   . CHF (congestive heart failure) ([place])   . COPD (chronic obstructive pulmonary disease) ([place])   . Coronary artery disease   . Diabetes mellitus without complication ([place])    History reviewed. No pertinent family history. History reviewed. No pertinent surgical history. Patient Active Problem List   Diagnosis Date Noted  . Acute respiratory failure with hypoxia ([place])   . Type II diabetes mellitus with renal manifestations ([place]) 08/30/2019  . HLD (hyperlipidemia) 08/30/2019  . Coronary artery disease   . COPD exacerbation ([place])   . Acute on chronic systolic CHF (congestive heart failure) ([place])   . DVT (deep venous thrombosis) ([place])   . Tobacco abuse   . Hypothermia   . Elevated troponin   . Acute on chronic respiratory failure with hypoxia ([place])   . Chest pain       Prior to Admission medications   Medication Sig Start Date End Date Taking? Authorizing Provider  albuterol (VENTOLIN HFA) 108 (90 Base) MCG/ACT inhaler Inhale 2  puffs into the lungs every 6 (six) hours as needed for wheezing or shortness of breath. 10/30/19   Merlyn Lot, MD  atorvastatin (LIPITOR) 10 MG tablet Take 10 mg by mouth daily.    [provider]  diazepam (VALIUM) 5 MG tablet Take 1 tablet (5 mg total) by mouth every 8 (eight) hours as needed for muscle spasms. 11/14/19 11/13/20  Merlyn Lot, MD  ELIQUIS 5 MG TABS tablet Take 5 mg by mouth 2 (two) times daily. 08/19/19   [provider]  FARXIGA 10 MG TABS tablet Take 10 mg by mouth daily.    [provider]  furosemide (LASIX) 20 MG tablet Take 20 mg by mouth daily.    [provider]  ipratropium-albuterol (DUONEB) 0.5-2.5 (3) MG/3ML SOLN Take 3 mLs by nebulization 4 (four) times daily as needed for wheezing or shortness of breath.    [provider]  metoprolol succinate (TOPROL-XL) 25 MG 24 hr tablet Take 25 mg by mouth daily.    [provider]  oxyCODONE-acetaminophen (PERCOCET) 5-325 MG tablet Take 1 tablet by mouth every 6 (six) hours as needed. 11/11/19 11/10/20  Triplett, Dessa Phi, FNP  sacubitril-valsartan (ENTRESTO) 24-26 MG Take 1 tablet by mouth 2 (two) times daily.    [provider]  spironolactone (ALDACTONE) 25 MG tablet Take 12.5 mg by mouth daily.    [provider]  Donnal Debar 100-62.5-25  MCG/INH AEPB Take 1 puff by mouth daily.    [provider]    Allergies Patient has no known allergies.    Social History Social History   Tobacco Use  . Smoking status: Current Every Day Smoker    Packs/day: 0.50    Types: Cigarettes  . Smokeless tobacco: Never Used  Substance Use Topics  . Alcohol use: Yes    Comment: occasionally  . Drug use: Never    Review of Systems Patient denies headaches, rhinorrhea, blurry vision, numbness, shortness of breath, chest pain, edema, cough, abdominal pain, nausea, vomiting, diarrhea, dysuria, fevers, rashes or hallucinations unless otherwise  stated above in HPI. ____________________________________________   PHYSICAL EXAM:  VITAL SIGNS: Vitals:   11/14/19 1519 11/14/19 1829  BP: (!) 159/108 (!) 133/91  Pulse: 97 99  Resp: 20 18  Temp:    SpO2: 98% 92%    Constitutional: Alert and oriented.  Eyes: Conjunctivae are normal.  Head: Atraumatic. Nose: No congestion/rhinnorhea. Mouth/Throat: Mucous membranes are moist.   Neck: No stridor. Painless ROM.  Cardiovascular: Normal rate, regular rhythm. Grossly normal heart sounds.  Good peripheral circulation. Respiratory: Normal respiratory effort.  No retractions. Lungs CTAB. Gastrointestinal: Soft and nontender. No distention. No abdominal bruits. No CVA tenderness. Genitourinary:  Musculoskeletal: No lower extremity tenderness nor edema.  No joint effusions. Neurologic:  Normal speech and language. No gross focal neurologic deficits are appreciated. No facial droop Skin:  Skin is warm, dry and intact. No rash noted. Psychiatric: Mood and affect are normal. Speech and behavior are normal.  ____________________________________________   LABS (all labs ordered are listed, but only abnormal results are displayed)  Results for orders placed or performed during the hospital encounter of 11/14/19 (from the past 24 hour(s))  Basic metabolic panel     Status: Abnormal   Collection Time: 11/14/19  1:40 PM  Result Value Ref Range   Sodium 141 135 - 145 mmol/L   Potassium 3.4 (L) 3.5 - 5.1 mmol/L   Chloride 102 98 - 111 mmol/L   CO2 30 22 - 32 mmol/L   Glucose, Bld 103 (H) 70 - 99 mg/dL   BUN 13 8 - 23 mg/dL   Creatinine, Ser 7.82 0.61 - 1.24 mg/dL   Calcium 9.2 8.9 - 95.6 mg/dL   GFR calc non Af Amer >60 >60 mL/min   GFR calc Af Amer >60 >60 mL/min   Anion gap 9 5 - 15  CBC     Status: Abnormal   Collection Time: 11/14/19  1:40 PM  Result Value Ref Range   WBC 5.6 4.0 - 10.5 K/uL   RBC 4.86 4.22 - 5.81 MIL/uL   Hemoglobin 15.4 13.0 - 17.0 g/dL   HCT 21.3 39 - 52 %     MCV 93.6 80.0 - 100.0 fL   MCH 31.7 26.0 - 34.0 pg   MCHC 33.8 30.0 - 36.0 g/dL   RDW 08.6 (H) 57.8 - 46.9 %   Platelets 214 150 - 400 K/uL   nRBC 0.0 0.0 - 0.2 %  Troponin I (High Sensitivity)     Status: Abnormal   Collection Time: 11/14/19  1:40 PM  Result Value Ref Range   Troponin I (High Sensitivity) 22 (H) <18 ng/L  Troponin I (High Sensitivity)     Status: Abnormal   Collection Time: 11/14/19  3:20 PM  Result Value Ref Range   Troponin I (High Sensitivity) 22 (H) <18 ng/L   ____________________________________________  EKG My review and personal  interpretation at Time: 13:30   Indication: shoulder pain  Rate: 95  Rhythm: sinus Axis: normal Other: ivcd, nonspecific st abn c/w previous tracing ____________________________________________  RADIOLOGY  I personally reviewed all radiographic images ordered to evaluate for the above acute complaints and reviewed radiology reports and findings.  These findings were personally discussed with the patient.  Please see medical record for radiology report.  ____________________________________________   PROCEDURES  Procedure(s) performed:  Procedures  Due to difficulty with obtaining IV access, a 20G peripheral IV catheter was inserted using US guidance into the LUE.  The site was prepped with chlorhexidine and allowed to dry.  The patient tolerated the procedure without any complications.     Critical Care performed: no ____________________________________________   INITIAL IMPRESSION / ASSESSMENT AND PLAN / ED COURSE  Pertinent labs & imaging results that were available during my care of the patient were reviewed by me and considered in my medical decision making (see chart for details).   DDX: acs, dissection, arthritis, torticollis, abscess, shingles  Scott Mcknight. is a 64 y.o. who presents to the ED with shoulder pain as described above.  Seen for same 3 days ago.  Is reported on Eliquis but is intermittently  compliant.  Is not complaining shortness of breath.  Does appear uncomfortable.  His exam is not consistent with septic arthritis no overlying cellulitis.  Seems to be significantly tender in the left trap muscles no evidence of overlying shingles.  His EKG is consistent with previous with a stable troponin of a lower suspicion for ACS but given his hypertension history CTA will be ordered to evaluate for dissection given his severe pain.  Clinical Course as of Nov 13 1928  Wed Nov 14, 2019  1754 I was notified by staff that the patient was making threatening statements to his nurse while try to obtain IV. Patient complaining about his wait time as well. I did appologize for his wait time but fortunately his work-up thus far is stable but I did recommend imaging to exclude other pathology but also emphasized the importance of him being respectful towards staff at this facility and informed that continued belligerent or threatening behavior towards staff would not be tolerated.   [PR]  1910 Patient feels significantly improved after Ativan.  I do have a high suspicion for torticollis and muscle spasm.  Cardiac work-up is stable EKG without any dynamic changes.  Seems noncardiac in nature.  On my review of the CTA I do not see any evidence aorta pathology.   [PR]  1926 CT imaging is stable.  Patient now pain-free.  Given his response to Ativan I will prescribe Valium is do suspect muscle spasm.  Discussed need for follow-up with PCP patient demonstrates understanding the signs symptoms which should return to the ER.   [PR]    Clinical Course User Index [PR] Willy Eddy, MD    The patient was evaluated in Emergency Department today for the symptoms described in the history of present illness. He/she was evaluated in the context of the global COVID-19 pandemic, which necessitated consideration that the patient might be at risk for infection with the SARS-CoV-2 virus that causes COVID-19.  Institutional protocols and algorithms that pertain to the evaluation of patients at risk for COVID-19 are in a state of rapid change based on information released by regulatory bodies including the CDC and federal and state organizations. These policies and algorithms were followed during the patient's care in the ED.  As part of  my medical decision making, I reviewed the following data within the electronic MEDICAL RECORD NUMBER Nursing notes reviewed and incorporated, Labs reviewed, notes from prior ED visits and [place] Controlled Substance Database   ____________________________________________   FINAL CLINICAL IMPRESSION(S) / ED DIAGNOSES  Final diagnoses:  Acute pain of left shoulder      NEW MEDICATIONS STARTED DURING THIS VISIT:  New Prescriptions   DIAZEPAM (VALIUM) 5 MG TABLET    Take 1 tablet (5 mg total) by mouth every 8 (eight) hours as needed for muscle spasms.     Note:  This document was prepared using Dragon voice recognition software and may include unintentional dictation errors.    Willy Eddy, MD 11/14/19 [phone]

## 2019-11-14 NOTE — ED Notes (Signed)
Two unsuccessful attempts by another RN. Dr. Roxan Hockey aware.

## 2019-11-14 NOTE — ED Notes (Addendum)
Pt up to desk again yelling and cursing. Demanding police.  Charge RN called and requested [employer] officer come to front.  "how many people are ahead of me, I need to know".  Pt still cursing at [employer].  Informed pt it is against the law to verbally abuse healthcare staff.

## 2019-11-14 NOTE — ED Notes (Signed)
Pt up to desk to check on wait. Pt updated.

## 2019-11-14 NOTE — ED Triage Notes (Signed)
This note is not being shared with the patient for the following reason: To prevent harm (release of this note would result in harm to the life or physical safety of the patient or another). Pt up to desk screaming and cursing "I am not in the mood for this, I am hurting".  Pt informed that everyone in lobby is hurting and we are waiting for rooms.  Pt states " you aren't doing anything get off your ass".  Pt informed needs to stop yelling and cursing at staff.  Security at their desk and aware of pt.

## 2019-11-14 NOTE — ED Notes (Signed)
Pt extremely verbally aggressive cursing at staff while trying to get blood. This RN took x1 attempt to get blood and was successul. Pt agreed to spot in hand prior to sticking. Pt then begins yelling about not getting help here and states "are there other hospitals??"

## 2019-11-14 NOTE — ED Triage Notes (Signed)
Pt comes with right shoulder pain. Seen recently and cleared. Given pain meds and states that he's out and he's still hurting. Also endorses chest pain and neck pain.

## 2019-12-17 ENCOUNTER — Other Ambulatory Visit: Payer: Self-pay

## 2019-12-17 ENCOUNTER — Emergency Department
Admission: EM | Admit: 2019-12-17 | Discharge: 2019-12-17 | Disposition: A | Discharge status: Home / Self Care | Payer: Medicare Other | Attending: Emergency Medicine | Admitting: Emergency Medicine

## 2019-12-17 ENCOUNTER — Emergency Department: Payer: Medicare Other

## 2019-12-17 DIAGNOSIS — I2510 Atherosclerotic heart disease of native coronary artery without angina pectoris: Secondary | ICD-10-CM | POA: Diagnosis not present

## 2019-12-17 DIAGNOSIS — J441 Chronic obstructive pulmonary disease with (acute) exacerbation: Secondary | ICD-10-CM

## 2019-12-17 DIAGNOSIS — R0789 Other chest pain: Secondary | ICD-10-CM

## 2019-12-17 DIAGNOSIS — J449 Chronic obstructive pulmonary disease, unspecified: Secondary | ICD-10-CM | POA: Insufficient documentation

## 2019-12-17 DIAGNOSIS — Z20822 Contact with and (suspected) exposure to covid-19: Secondary | ICD-10-CM | POA: Diagnosis not present

## 2019-12-17 DIAGNOSIS — I509 Heart failure, unspecified: Secondary | ICD-10-CM | POA: Insufficient documentation

## 2019-12-17 DIAGNOSIS — E119 Type 2 diabetes mellitus without complications: Secondary | ICD-10-CM | POA: Insufficient documentation

## 2019-12-17 DIAGNOSIS — R0602 Shortness of breath: Secondary | ICD-10-CM | POA: Insufficient documentation

## 2019-12-17 DIAGNOSIS — J45909 Unspecified asthma, uncomplicated: Secondary | ICD-10-CM | POA: Insufficient documentation

## 2019-12-17 DIAGNOSIS — R079 Chest pain, unspecified: Secondary | ICD-10-CM | POA: Insufficient documentation

## 2019-12-17 LAB — TROPONIN I (HIGH SENSITIVITY)
Troponin I (High Sensitivity): 15 ng/L (ref <18)
Troponin I (High Sensitivity): 13 ng/L (ref <18)

## 2019-12-17 LAB — SARS CORONAVIRUS 2 BY RT PCR (HOSPITAL ORDER, PERFORMED IN ~~LOC~~ HOSPITAL LAB): SARS Coronavirus 2: NEGATIVE

## 2019-12-17 LAB — BASIC METABOLIC PANEL
Anion gap: 12 (ref 5–15)
BUN: 21 mg/dL (ref 8–23)
CO2: 29 mmol/L (ref 22–32)
Calcium: 9.1 mg/dL (ref 8.9–10.3)
Chloride: 99 mmol/L (ref 98–111)
Creatinine, Ser: 1.42 mg/dL — ABNORMAL HIGH (ref 0.61–1.24)
GFR calc Af Amer: >60 mL/min (ref >60)
GFR calc non Af Amer: 53 mL/min — ABNORMAL LOW (ref >60)
Glucose, Bld: 105 mg/dL — ABNORMAL HIGH (ref 70–99)
Potassium: 3.7 mmol/L (ref 3.5–5.1)
Sodium: 140 mmol/L (ref 135–145)

## 2019-12-17 LAB — CBC
HCT: 48.2 % (ref 39.0–52.0)
Hemoglobin: 16.7 g/dL (ref 13.0–17.0)
MCH: 31.5 pg (ref 26.0–34.0)
MCHC: 34.6 g/dL (ref 30.0–36.0)
MCV: 90.9 fL (ref 80.0–100.0)
Platelets: 240 10*3/uL (ref 150–400)
RBC: 5.3 MIL/uL (ref 4.22–5.81)
RDW: 14.5 % (ref 11.5–15.5)
WBC: 3.6 10*3/uL — ABNORMAL LOW (ref 4.0–10.5)
nRBC: 0 % (ref 0.0–0.2)

## 2019-12-17 LAB — FIBRIN DERIVATIVES D-DIMER (ARMC ONLY): Fibrin derivatives D-dimer (ARMC): 601.84 ng/mL (FEU) — ABNORMAL HIGH (ref 0.00–499.00)

## 2019-12-17 MED ORDER — IOHEXOL 350 MG/ML SOLN
75.0000 mL | Freq: Once | INTRAVENOUS | Status: AC | PRN
  Administered 2019-12-17: 75 mL via INTRAVENOUS

## 2019-12-17 MED ORDER — HYDROCOD POLST-CPM POLST ER 10-8 MG/5ML PO SUER: 5.0000 mL | Freq: Two times a day (BID) | ORAL | 0 refills | Status: AC

## 2019-12-17 MED ORDER — MORPHINE SULFATE (PF) 4 MG/ML IV SOLN
4.0000 mg | Freq: Once | INTRAVENOUS | Status: AC
  Administered 2019-12-17: 4 mg via INTRAVENOUS
  Filled 2019-12-17: qty 1

## 2019-12-17 MED ORDER — METHYLPREDNISOLONE SODIUM SUCC 125 MG IJ SOLR
125.0000 mg | Freq: Once | INTRAMUSCULAR | Status: AC
  Administered 2019-12-17: 125 mg via INTRAVENOUS
  Filled 2019-12-17: qty 2

## 2019-12-17 MED ORDER — SODIUM CHLORIDE 0.9% FLUSH: 3.0000 mL | Freq: Once | INTRAVENOUS | Status: DC

## 2019-12-17 MED ORDER — PREDNISONE 10 MG (21) PO TBPK: ORAL_TABLET | ORAL | 0 refills | Status: AC

## 2019-12-17 MED ORDER — IPRATROPIUM-ALBUTEROL 0.5-2.5 (3) MG/3ML IN SOLN
3.0000 mL | Freq: Once | RESPIRATORY_TRACT | Status: AC
  Administered 2019-12-17: 3 mL via RESPIRATORY_TRACT
  Filled 2019-12-17: qty 3

## 2019-12-17 MED ORDER — SODIUM CHLORIDE 0.9 % IV BOLUS
500.0000 mL | Freq: Once | INTRAVENOUS | Status: AC
  Administered 2019-12-17: 500 mL via INTRAVENOUS

## 2019-12-17 NOTE — ED Notes (Signed)
Pt presents w/ chest pain x 2 days. Pt states he was tx for the same 3 months ago.

## 2019-12-17 NOTE — ED Triage Notes (Signed)
PT to ED via POV c/o CP/SHOB x2 days. PT states cardiac hx. CP is midsternal and of pounding nature. PT has COPD and is more short of breath than normal

## 2019-12-17 NOTE — ED Provider Notes (Signed)
ER Provider Note       Time seen: 4:13 PM    I have reviewed the vital signs and the nursing notes.  HISTORY   Chief Complaint Chest Pain and Shortness of Breath   HPI Scott Mcknight. is a 64 y.o. male with a history of asthma, CHF, COPD, coronary disease, diabetes who presents today for chest pain shortness of breath for the past 2 days.  He does state he has a cardiac history, chest pain is midsternal and pounding.  He has COPD is more short of breath than normal.  Pain is 9 out of 10.  Past Medical History:  Diagnosis Date  . Asthma   . CHF (congestive heart failure) ([facility])   . COPD (chronic obstructive pulmonary disease) ([facility])   . Coronary artery disease   . Diabetes mellitus without complication ([facility])     History reviewed. No pertinent surgical history.  Allergies Patient has no known allergies.   Review of Systems Constitutional: Negative for fever. Cardiovascular: Positive for chest pain Respiratory: Positive for shortness of breath Gastrointestinal: Negative for abdominal pain, vomiting and diarrhea. Musculoskeletal: Negative for back pain. Skin: Negative for rash. Neurological: Negative for headaches, focal weakness or numbness.  All systems negative/normal/unremarkable except as stated in the HPI  ____________________________________________   PHYSICAL EXAM:  VITAL SIGNS: Vitals:   12/17/19 1545 12/17/19 1548  BP: (!) 135/116   Pulse: 92   Resp: 19   Temp:  98.3 F (36.8 C)  SpO2: 98%     Constitutional: Alert and oriented. Well appearing and in no distress. Eyes: Conjunctivae are normal. Normal extraocular movements. ENT      Head: Normocephalic and atraumatic.      Nose: No congestion/rhinnorhea.      Mouth/Throat: Mucous membranes are moist.      Neck: No stridor. Cardiovascular: Normal rate, regular rhythm. No murmurs, rubs, or gallops. Respiratory: Mild wheezing bilaterally Gastrointestinal: Soft and nontender. Normal bowel  sounds Musculoskeletal: Nontender with normal range of motion in extremities. No lower extremity tenderness nor edema. Neurologic:  Normal speech and language. No gross focal neurologic deficits are appreciated.  Skin:  Skin is warm, dry and intact. No rash noted. Psychiatric: Speech and behavior are normal.  ____________________________________________  EKG: Interpreted by me.  Sinus rhythm the rate of 92 bpm, rightward axis, LVH with repolarization abnormality, marked T wave abnormalities  ____________________________________________   LABS (pertinent positives/negatives)  Labs Reviewed  CBC - Abnormal; Notable for the following components:      Result Value   WBC 3.6 (*)    All other components within normal limits  BASIC METABOLIC PANEL - Abnormal; Notable for the following components:   Glucose, Bld 105 (*)    Creatinine, Ser 1.42 (*)    GFR calc non Af Amer 53 (*)    All other components within normal limits  FIBRIN DERIVATIVES D-DIMER ([facility] ONLY) - Abnormal; Notable for the following components:   Fibrin derivatives D-dimer ([facility]) 601.84 (*)    All other components within normal limits  SARS CORONAVIRUS 2 BY RT PCR (HOSPITAL ORDER, PERFORMED IN [place] [facility])  TROPONIN I (HIGH SENSITIVITY)  TROPONIN I (HIGH SENSITIVITY)    RADIOLOGY  Images were viewed by me Chest x-ray does not reveal any acute process CTA  IMPRESSION: No central, segmental or subsegmental pulmonary embolism.  Prominence of the main pulmonary arteries which could be suggestive pulmonary arterial hypertension.  Emphysema (ICD10-J43.9).  Aortic Atherosclerosis (ICD10-I70.0).  DIFFERENTIAL DIAGNOSIS  Unstable  angina, MI, PE, COPD, musculoskeletal pain, GERD  ASSESSMENT AND PLAN  Chest pain, shortness of breath   Plan: The patient had presented for chest pain and shortness of breath. Patient's labs revealed an elevated D-dimer but repeat troponin was unremarkable.  CTA chest  was reassuring.  He is cleared for outpatient follow-up.  Daryel November MD    Note: This note was generated in part or whole with voice recognition software. Voice recognition is usually quite accurate but there are transcription errors that can and very often do occur. I apologize for any typographical errors that were not detected and corrected.     Emily Filbert, MD 12/17/19 2036

## 2019-12-17 NOTE — ED Notes (Signed)
IV team at bedside 

## 2019-12-17 NOTE — ED Notes (Signed)
Patient transported to X-ray 

## 2020-01-16 ENCOUNTER — Emergency Department: Payer: Medicare Other

## 2020-01-16 ENCOUNTER — Encounter: Payer: Self-pay | Admitting: Emergency Medicine

## 2020-01-16 DIAGNOSIS — R0789 Other chest pain: Secondary | ICD-10-CM | POA: Insufficient documentation

## 2020-01-16 DIAGNOSIS — R110 Nausea: Secondary | ICD-10-CM | POA: Diagnosis not present

## 2020-01-16 DIAGNOSIS — I509 Heart failure, unspecified: Secondary | ICD-10-CM | POA: Diagnosis not present

## 2020-01-16 DIAGNOSIS — R0602 Shortness of breath: Secondary | ICD-10-CM | POA: Diagnosis not present

## 2020-01-16 DIAGNOSIS — Z5321 Procedure and treatment not carried out due to patient leaving prior to being seen by health care provider: Secondary | ICD-10-CM | POA: Insufficient documentation

## 2020-01-16 DIAGNOSIS — J449 Chronic obstructive pulmonary disease, unspecified: Secondary | ICD-10-CM | POA: Insufficient documentation

## 2020-01-16 LAB — CBC
HCT: 44.4 % (ref 39.0–52.0)
Hemoglobin: 15.4 g/dL (ref 13.0–17.0)
MCH: 31.7 pg (ref 26.0–34.0)
MCHC: 34.7 g/dL (ref 30.0–36.0)
MCV: 91.4 fL (ref 80.0–100.0)
Platelets: 280 10*3/uL (ref 150–400)
RBC: 4.86 MIL/uL (ref 4.22–5.81)
RDW: 14.1 % (ref 11.5–15.5)
WBC: 6.5 10*3/uL (ref 4.0–10.5)
nRBC: 0 % (ref 0.0–0.2)

## 2020-01-16 LAB — BRAIN NATRIURETIC PEPTIDE: B Natriuretic Peptide: 841.6 pg/mL — ABNORMAL HIGH (ref 0.0–100.0)

## 2020-01-16 LAB — BASIC METABOLIC PANEL
Anion gap: 12 (ref 5–15)
BUN: 33 mg/dL — ABNORMAL HIGH (ref 8–23)
CO2: 30 mmol/L (ref 22–32)
Calcium: 8.6 mg/dL — ABNORMAL LOW (ref 8.9–10.3)
Chloride: 97 mmol/L — ABNORMAL LOW (ref 98–111)
Creatinine, Ser: 1.31 mg/dL — ABNORMAL HIGH (ref 0.61–1.24)
GFR calc Af Amer: >60 mL/min (ref >60)
GFR calc non Af Amer: 58 mL/min — ABNORMAL LOW (ref >60)
Glucose, Bld: 106 mg/dL — ABNORMAL HIGH (ref 70–99)
Potassium: 3.7 mmol/L (ref 3.5–5.1)
Sodium: 139 mmol/L (ref 135–145)

## 2020-01-16 LAB — TROPONIN I (HIGH SENSITIVITY): Troponin I (High Sensitivity): 16 ng/L (ref <18)

## 2020-01-16 NOTE — ED Triage Notes (Signed)
Pt c/o left sided chest pain with SOB and nausea x2 days. Pt reports SOB worsening tonight. Hx/o CHF and COPD.

## 2020-01-17 ENCOUNTER — Emergency Department
Admission: EM | Admit: 2020-01-17 | Discharge: 2020-01-17 | Disposition: A | Discharge status: Home / Self Care | Payer: Medicare Other | Attending: Emergency Medicine | Admitting: Emergency Medicine

## 2020-01-17 NOTE — ED Notes (Signed)
No answer when called several times from lobby 

## 2020-01-17 NOTE — ED Notes (Signed)
Pt ambulatory to STAT desk to inquire over wait time; pt informed of such and begin yelling and cursing stating "yall pushing people ahead of me! I'm getting ready to go off!"; explained to pt that no one has gone ahead of him; pt cont to yell and curse; security over to speak to pt

## 2020-01-17 NOTE — ED Notes (Signed)
Pt called for his repeat time sensitive troponin to be collected. Pt was sitting in the lobby and would not answer to his name being called. The tech called multiple times and he continued to ignore his name being called. Registration showed tech where pt was sitting. Pt yelled loudly, "What so you can just leave me sitting out here for ten more hours? My heart hurts." Tech explained that this is why we draw labs in triage to make sure there is nothing critical to be alarmed for. Tech asked pt again if he would like to come with her to have his repeat heart enzyme lab drawn. Pt ignored tech question. At which point Raquel RN asked him and he said "no". Raquel said that he can refused. Raquel charted that pt refused labs. When we went back out into the lobby pt had left the building.   Lw edt

## 2020-01-17 NOTE — ED Notes (Signed)
Pt refusing repeat troponin at this time.

## 2020-06-24 DEATH — deceased

## 2021-01-22 IMAGING — CT CT ANGIO CHEST
2 of 6 series · 19 of 46 positions shown · IV contrast (omnipaque)
Comparison: 08/19/2019

CLINICAL DATA: Shortness of breath

EXAM:
CT ANGIOGRAPHY CHEST WITH CONTRAST
TECHNIQUE: Multidetector CT imaging of the chest was performed using the
standard protocol during bolus administration of intravenous
contrast. Multiplanar CT image reconstructions and MIPs were
obtained to evaluate the vascular anatomy.
CONTRAST:  75mL OMNIPAQUE IOHEXOL 350 MG/ML SOLN

[Series 5: thins · axial · 0.65mm/px · z∈[-386,-86]mm · 16 of 329 slices shown]
[im 15/329  lung]
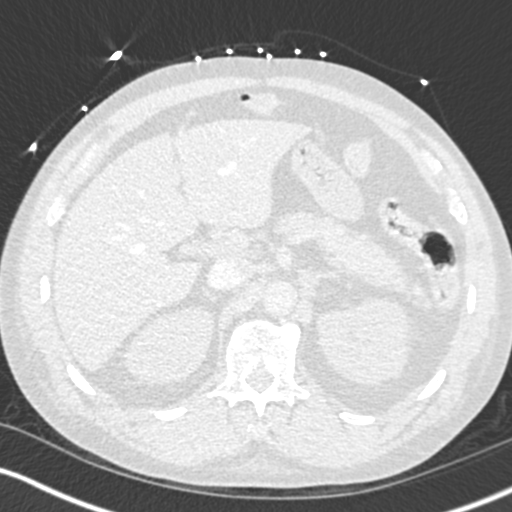
[im 43/329  soft-tissue]
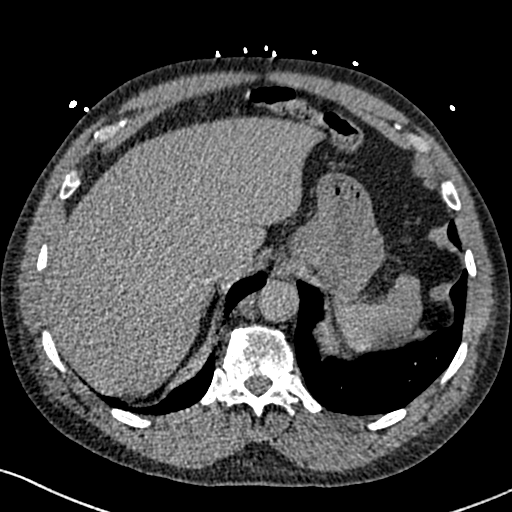
[im 58/329  lung]
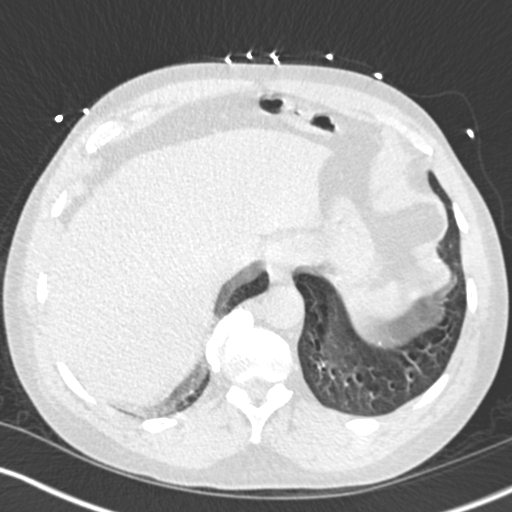
[im 72/329  soft-tissue]
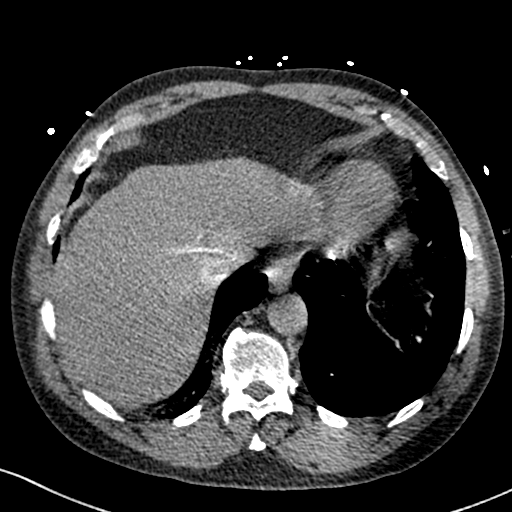
[im 100/329  lung]
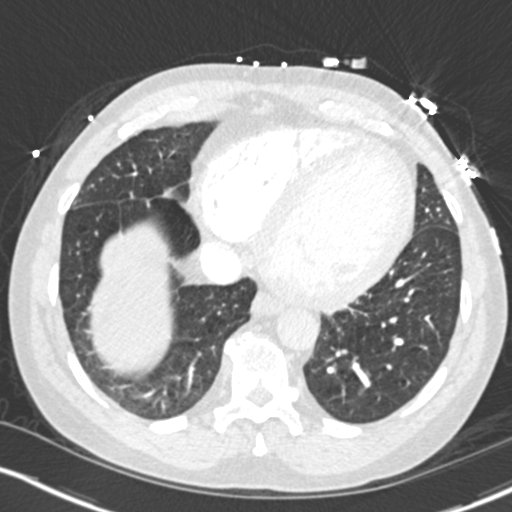
[im 115/329  soft-tissue]
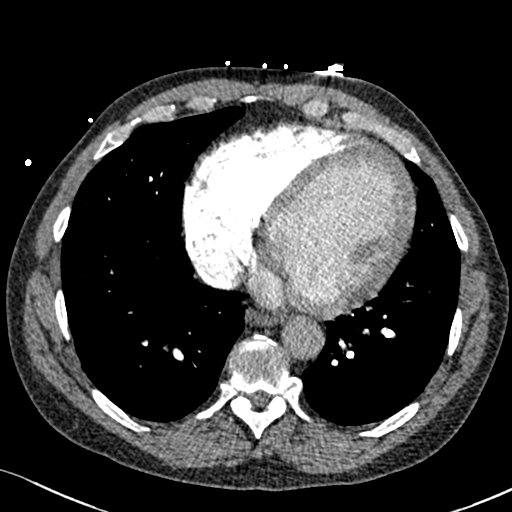
[im 129/329  lung]
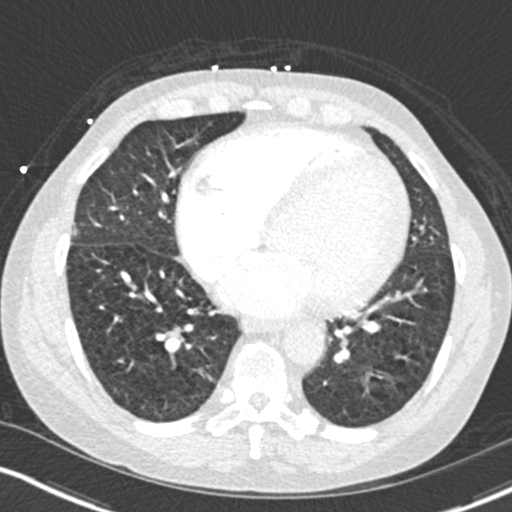
[im 157/329  soft-tissue]
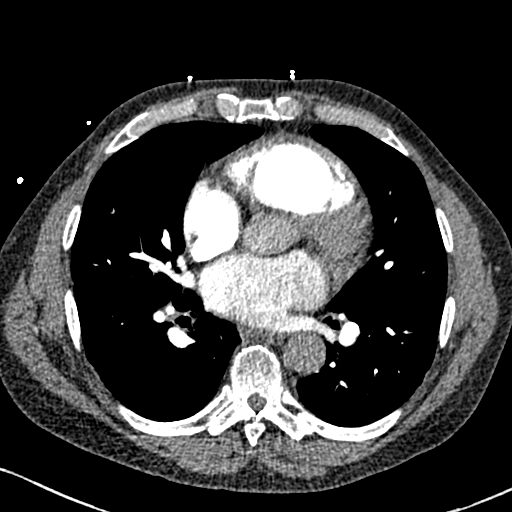
[im 172/329  lung]
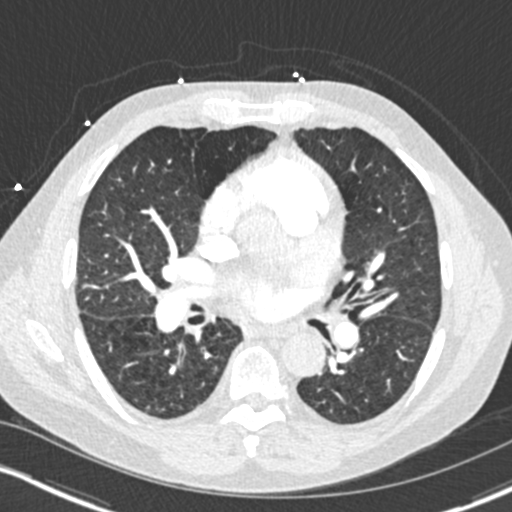
[im 200/329  soft-tissue]
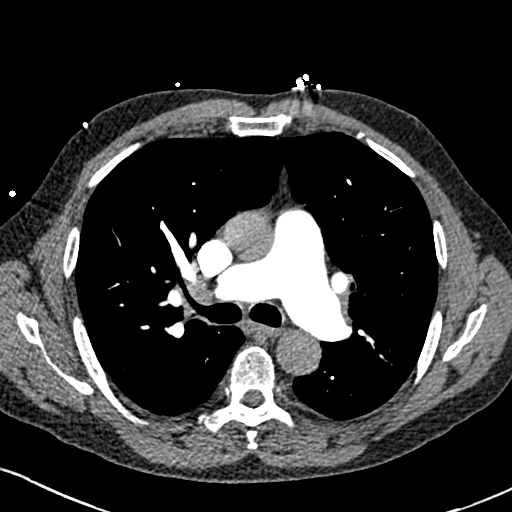
[im 214/329  lung]
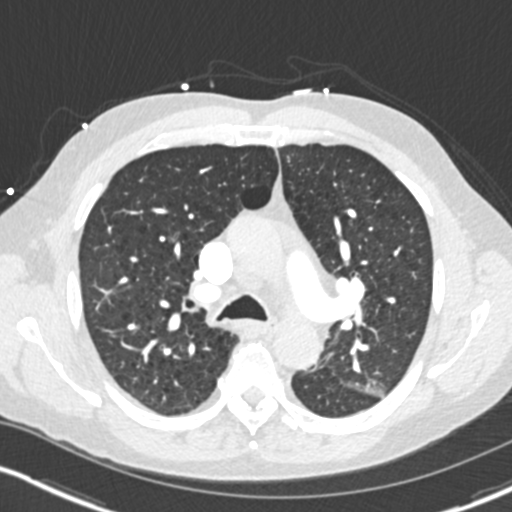
[im 229/329  soft-tissue]
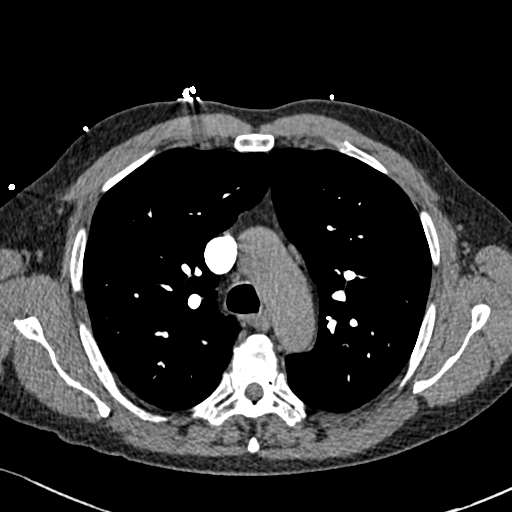
[im 257/329  lung]
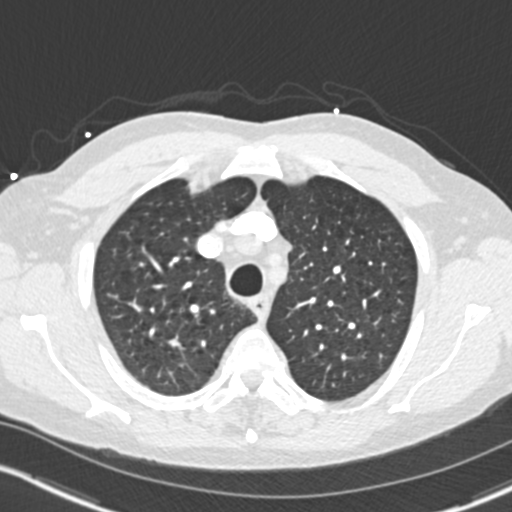
[im 271/329  soft-tissue]
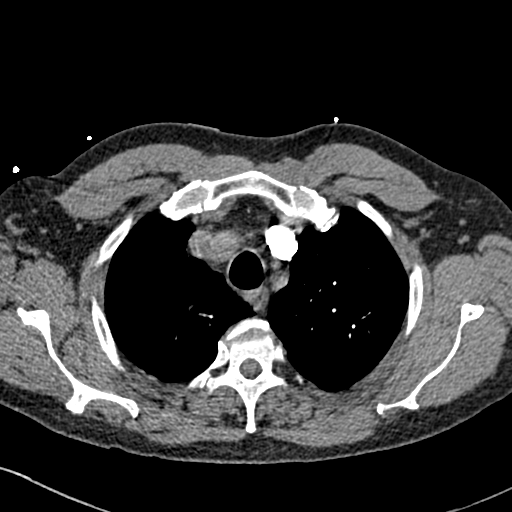
[im 286/329  lung]
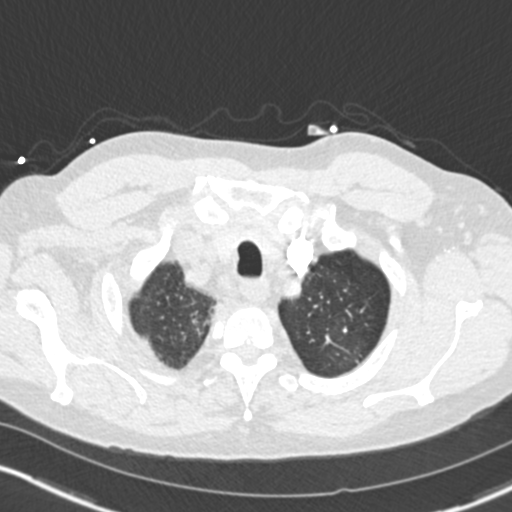
[im 314/329  soft-tissue]
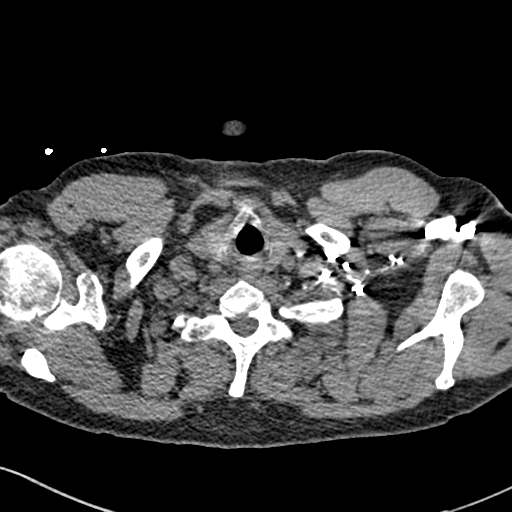

[Series 7: coronal mpr · coronal · 0.70mm/px · 3 of 93 slices shown]
[im 24/93  soft-tissue]
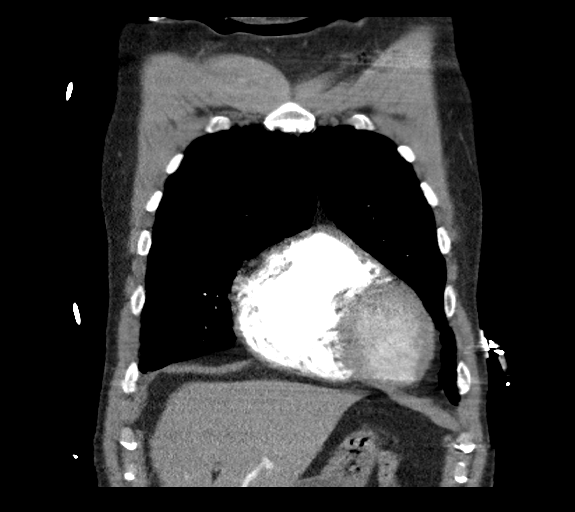
[im 47/93  soft-tissue]
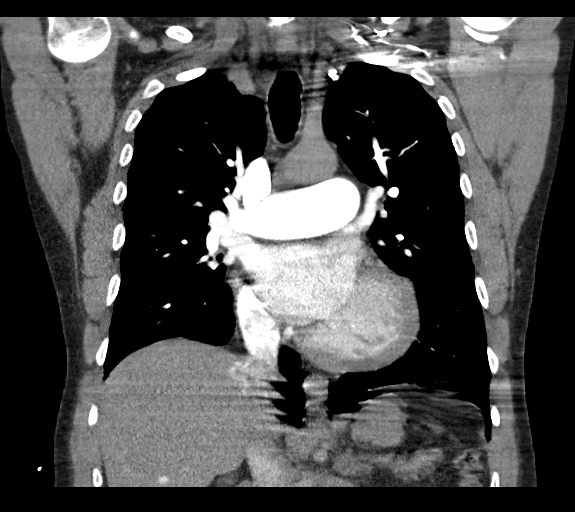
[im 70/93  soft-tissue]
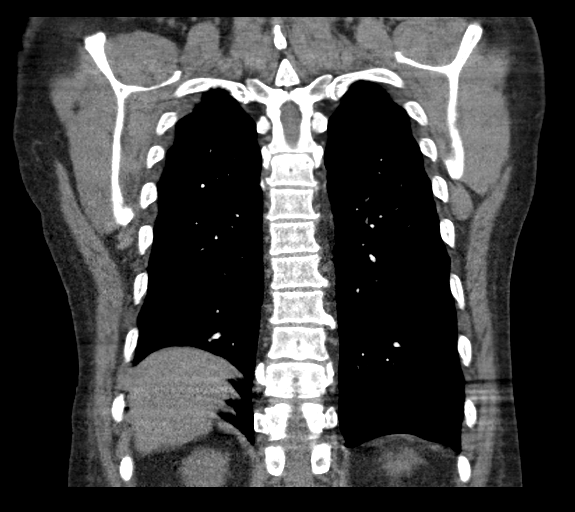

[19 of 46 positions shown; findings below may reference images not displayed]

FINDINGS: Cardiovascular: No filling defects in the pulmonary arteries to
suggest pulmonary emboli. Cardiomegaly. Aorta is normal caliber.

Mediastinum/Nodes: No mediastinal, hilar, or axillary adenopathy.
Trachea and esophagus are unremarkable. Thyroid unremarkable.

Lungs/Pleura: Lungs are clear. No focal airspace opacities or
suspicious nodules. No effusions.

Upper Abdomen: Imaging into the upper abdomen shows no acute
findings.

Musculoskeletal: Chest wall soft tissues are unremarkable. No acute
bony abnormality.

Review of the MIP images confirms the above findings.
IMPRESSION: No evidence of pulmonary embolus.

Cardiomegaly.

No acute cardiopulmonary disease.
